# Patient Record
Sex: Female | Born: 1955 | Race: White | Hispanic: No | Marital: Married | Smoking: Former smoker
Health system: Southern US, Community
[De-identification: ages and names within clinical notes are randomized; demographics above are authoritative.]

## PROBLEM LIST (undated history)

## (undated) DIAGNOSIS — J45909 Unspecified asthma, uncomplicated: Secondary | ICD-10-CM

## (undated) DIAGNOSIS — I8393 Asymptomatic varicose veins of bilateral lower extremities: Secondary | ICD-10-CM

## (undated) DIAGNOSIS — O139 Gestational [pregnancy-induced] hypertension without significant proteinuria, unspecified trimester: Secondary | ICD-10-CM

## (undated) HISTORY — PX: BREAST REDUCTION SURGERY: SHX8

## (undated) HISTORY — DX: Unspecified asthma, uncomplicated: J45.909

## (undated) HISTORY — DX: Gestational (pregnancy-induced) hypertension without significant proteinuria, unspecified trimester: O13.9

## (undated) HISTORY — PX: ABDOMINAL HYSTERECTOMY: SHX81

## (undated) HISTORY — DX: Asymptomatic varicose veins of bilateral lower extremities: I83.93

---

## 2002-08-22 ENCOUNTER — Other Ambulatory Visit: Admission: RE | Admit: 2002-08-22 | Discharge: 2002-08-22 | Payer: Self-pay | Admitting: Family Medicine

## 2016-12-12 ENCOUNTER — Encounter: Payer: Self-pay | Admitting: Vascular Surgery

## 2016-12-13 ENCOUNTER — Ambulatory Visit (HOSPITAL_COMMUNITY)
Admission: RE | Admit: 2016-12-13 | Discharge: 2016-12-13 | Disposition: A | Discharge status: Home / Self Care | Payer: BLUE CROSS/BLUE SHIELD | Source: Ambulatory Visit | Attending: Vascular Surgery | Admitting: Vascular Surgery

## 2016-12-13 ENCOUNTER — Other Ambulatory Visit: Payer: Self-pay | Admitting: *Deleted

## 2016-12-13 ENCOUNTER — Ambulatory Visit (INDEPENDENT_AMBULATORY_CARE_PROVIDER_SITE_OTHER): Payer: BLUE CROSS/BLUE SHIELD | Admitting: Vascular Surgery

## 2016-12-13 ENCOUNTER — Encounter: Payer: Self-pay | Admitting: Vascular Surgery

## 2016-12-13 VITALS — BP 134/78 | HR 75 | Temp 97.9°F | Resp 16 | Ht 63.0 in | Wt 189.0 lb

## 2016-12-13 DIAGNOSIS — I83892 Varicose veins of left lower extremities with other complications: Secondary | ICD-10-CM | POA: Diagnosis not present

## 2016-12-13 DIAGNOSIS — I868 Varicose veins of other specified sites: Secondary | ICD-10-CM

## 2016-12-13 DIAGNOSIS — I83891 Varicose veins of right lower extremities with other complications: Secondary | ICD-10-CM

## 2016-12-13 NOTE — Progress Notes (Signed)
Subjective:     Patient ID: Ann Hatfield, female   DOB: 11/24/1955, 61 y.o.   MRN: [id number]  HPI This 61 year old female was referred by MelindaWinnikur,np for evaluation of varicose veins primarily on the left. Patient complains of swelling and aching discomfort in the left leg primarily in the thigh and calf area which worsens as the day progresses. She has no history of DVT thrombophlebitis stasis ulcers or bleeding. She will occasionally where short leg elastic compression stockings which controls the swelling somewhat. Her symptoms are worsening and she continues to get more prominent bulges in the left calf area as time goes on.  Past Medical History:  Diagnosis Date  . Varicose veins of both lower extremities     Social History  Substance Use Topics  . Smoking status: Former Smoker    Quit date: 12/13/2016  . Smokeless tobacco: Never Used  . Alcohol use Yes     Comment: rarely    No family history on file.  Allergies  Allergen Reactions  . Soy Allergy Other (See Comments)    erythema  . Penicillins Rash    Syncope,   Was given when was a child, none since  . Clindamycin      Current Outpatient Prescriptions:  .  valACYclovir (VALTREX) 1000 MG tablet, valacyclovir 1 gram tablet  TK 2 TS PO BID FOR 5 DAYS, Disp: , Rfl:  .  albuterol (PROAIR HFA) 108 (90 Base) MCG/ACT inhaler, Inhale into the lungs., Disp: , Rfl:  .  famotidine-calcium carbonate-magnesium hydroxide (PEPCID COMPLETE) 10-800-165 MG chewable tablet, Chew by mouth., Disp: , Rfl:  .  methylPREDNISolone acetate (DEPO-MEDROL) 40 MG/ML injection, Depo-Medrol 40 mg/mL suspension for injection  Take 1 mL by injection route., Disp: , Rfl:  .  polyethylene glycol powder (GLYCOLAX/MIRALAX) powder, Take by mouth., Disp: , Rfl:   Vitals:   12/13/16 1009  BP: 134/78  Pulse: 75  Resp: 16  Temp: 97.9 F (36.6 C)  SpO2: 98%  Weight: 189 lb (85.7 kg)  Height: 5\' 3"  (1.6 m)    Body mass index is 33.48  kg/m.         Review of Systems Denies chest pain, dyspnea on exertion, PND, orthopnea, hemoptysis, claudication. Occasionally develops numbness in her feet and hands very transiently area has no history of cervical or lumbar spine problems.      Objective:   Physical Exam BP 134/78 (BP Location: Left Arm, Patient Position: Sitting, Cuff Size: Normal)   Pulse 75   Temp 97.9 F (36.6 C)   Resp 16   Ht 5\' 3"  (1.6 m)   Wt 189 lb (85.7 kg)   SpO2 98%   BMI 33.48 kg/m     Gen.-alert and oriented x3 in no apparent distress HEENT normal for age Lungs no rhonchi or wheezing Cardiovascular regular rhythm no murmurs carotid pulses 3+ palpable no bruits audible Abdomen soft nontender no palpable masses Musculoskeletal free of  major deformities Skin clear -no rashes Neurologic normal Lower extremities 3+ femoral and dorsalis pedis pulses palpable bilaterally with no edema on the right mild edema on the left Bulging varicosities left medial calf with no hyperpigmentation or ulceration. Scattered spider and reticular veins bilaterally with prominent pattern on right lateral thigh, medial thigh, and lateral calf. No large bulging varicosities on the right.  Today I performed a bedside SonoSite ultrasound exam which reveals an enlarged left great saphenous vein with gross reflux supplying these painful varicosities  I ordered a formal venous  reflux exam the left leg today which I reviewed and interpreted. There is gross reflux and an enlarged left great saphenous vein supplying these painful varicosities with no DVT      Assessment:     Painful varicosities left leg due to gross reflux and large left great saphenous vein causing symptoms which are affecting patient's daily living    Plan:         #1 long leg elastic compression stockings 20-30 mm gradient #2 elevate legs as much as possible #3 ibuprofen daily on a regular basis for pain #4 return in 3 months-if no  significant improvement then patient will likely need laser ablation left great saphenous vein with multiple stab phlebectomy Return in 3 months

## 2016-12-15 ENCOUNTER — Encounter: Payer: Self-pay | Admitting: Vascular Surgery

## 2017-03-21 ENCOUNTER — Other Ambulatory Visit: Payer: Self-pay

## 2017-03-21 ENCOUNTER — Encounter: Payer: Self-pay | Admitting: Vascular Surgery

## 2017-03-21 ENCOUNTER — Ambulatory Visit: Payer: BLUE CROSS/BLUE SHIELD | Admitting: Vascular Surgery

## 2017-03-21 VITALS — BP 120/75 | HR 69 | Temp 97.5°F | Resp 16 | Ht 62.0 in | Wt 187.0 lb

## 2017-03-21 DIAGNOSIS — I83892 Varicose veins of left lower extremities with other complications: Secondary | ICD-10-CM | POA: Diagnosis not present

## 2017-03-21 NOTE — Progress Notes (Signed)
Subjective:     Patient ID: Ann Hatfield, female   DOB: 09/17/1955, 61 y.o.   MRN: [id number]  HPI This 61 year old female returns for 3 month follow-up regarding her painful varicose veins in left leg. She continues to have aching throbbing burning discomfort quickly and the calf and ankle areas the day progresses. She would like treatment because it is affecting her daily living.  Past Medical History:  Diagnosis Date  . Varicose veins of both lower extremities     Social History   Tobacco Use  . Smoking status: Former Smoker    Last attempt to quit: 12/13/2016    Years since quitting: 0.2  . Smokeless tobacco: Never Used  Substance Use Topics  . Alcohol use: Yes    Comment: rarely    Family History  Problem Relation Age of Onset  . Diabetes Mother   . Hypertension Mother   . Hyperlipidemia Mother   . Heart disease Mother     Allergies  Allergen Reactions  . Soy Allergy Other (See Comments)    erythema  . Penicillins Rash    Syncope,   Was given when was a child, none since  . Clindamycin      Current Outpatient Medications:  .  albuterol (PROAIR HFA) 108 (90 Base) MCG/ACT inhaler, Inhale into the lungs., Disp: , Rfl:  .  famotidine-calcium carbonate-magnesium hydroxide (PEPCID COMPLETE) 10-800-165 MG chewable tablet, Chew by mouth., Disp: , Rfl:  .  polyethylene glycol powder (GLYCOLAX/MIRALAX) powder, Take by mouth., Disp: , Rfl:  .  valACYclovir (VALTREX) 1000 MG tablet, valacyclovir 1 gram tablet  TK 2 TS PO BID FOR 5 DAYS, Disp: , Rfl:  .  methylPREDNISolone acetate (DEPO-MEDROL) 40 MG/ML injection, Depo-Medrol 40 mg/mL suspension for injection  Take 1 mL by injection route., Disp: , Rfl:   Vitals:   03/21/17 1028  BP: 120/75  Pulse: 69  Resp: 16  Temp: (!) 97.5 F (36.4 C)  TempSrc: Oral  SpO2: 100%  Weight: 187 lb (84.8 kg)  Height: 5\' 2"  (1.575 m)    Body mass index is 34.2 kg/m.         Review of Systems Denies chest pain, dyspnea on  exertion, PND, orthopnea, hemoptysis, claudication    Objective:   Physical Exam BP 120/75 (BP Location: Left Arm, Patient Position: Sitting, Cuff Size: Normal)   Pulse 69   Temp (!) 97.5 F (36.4 C) (Oral)   Resp 16   Ht 5\' 2"  (1.575 m)   Wt 187 lb (84.8 kg)   SpO2 100%   BMI 34.20 kg/m   Gen. well-developed female no apparent distress alert and oriented 3 Lungs no rhonchi or wheezing Left leg with bulging varicosities in the distal medial thigh and medial calf with 1+ distal edema. Large pattern of spider telangiectasia on the lateral thigh and some on the medial and lateral ankle area. No hyperpigmentation or ulceration noted. 3+ dorsalis pedis pulse palpable.  Today I reviewed the ultrasound performed last visit which revealed a large caliber left great saphenous vein with gross reflux supplying these painful varicosities     Assessment:     Painful varicosities left leg due to gross reflux left great saphenous vein causing symptoms which are affecting patient's daily living and resistant to conservative measures including long-leg elastic compression stockings 20-30 millimeter gradient, elevation, and ibuprofen-CEAP3    Plan:     Patient needs laser ablation left great saphenous vein +10-20 stab phlebectomy of painful varicosities and 2  courses of foam sclerotherapy for her treatment We'll proceed with precertification to perform this in the near future and early for symptoms

## 2017-03-23 ENCOUNTER — Other Ambulatory Visit: Payer: Self-pay | Admitting: *Deleted

## 2017-03-23 DIAGNOSIS — I83892 Varicose veins of left lower extremities with other complications: Secondary | ICD-10-CM

## 2017-04-03 ENCOUNTER — Ambulatory Visit (INDEPENDENT_AMBULATORY_CARE_PROVIDER_SITE_OTHER): Payer: BLUE CROSS/BLUE SHIELD | Admitting: Vascular Surgery

## 2017-04-03 ENCOUNTER — Encounter: Payer: Self-pay | Admitting: Vascular Surgery

## 2017-04-03 VITALS — BP 128/78 | HR 70 | Temp 98.0°F | Resp 18 | Ht 62.0 in | Wt 187.0 lb

## 2017-04-03 DIAGNOSIS — I83892 Varicose veins of left lower extremities with other complications: Secondary | ICD-10-CM

## 2017-04-03 HISTORY — PX: ENDOVENOUS ABLATION SAPHENOUS VEIN W/ LASER: SUR449

## 2017-04-03 NOTE — Progress Notes (Signed)
Subjective:     Patient ID: Ernestina ColumbiaRita B Hufstetler, female   DOB: 11/01/1955, 61 y.o.   MRN: [id number]  HPI This 61 year old female laser ablation left great saphenous vein from the proximal calf to near the saphenofemoral junction +10-20 stab phlebectomy of painful varicosities performed under local tumescent anesthesia. A total of 2245 J of energy was utilized. She tolerated the procedures well.  Review of Systems     Objective:   Physical Exam BP 128/78 (BP Location: Left Arm, Patient Position: Sitting, Cuff Size: Normal)   Pulse 70   Temp 98 F (36.7 C) (Oral)   Resp 18   Ht 5\' 2"  (1.575 m)   Wt 187 lb (84.8 kg)   SpO2 99%   BMI 34.20 kg/m        Assessment:     Well-tolerated laser ablation left great saphenous vein plus multiple stab phlebectomy of painful varicosities performed under local tumescent anesthesia    Plan:     Return in 1 week for venous duplex exam to confirm closure left great saphenous vein

## 2017-04-03 NOTE — Progress Notes (Signed)
Laser Ablation Procedure    Date: 04/03/2017   Ernestina ColumbiaRita B Siefker DOB:10/10/1955  Consent signed: Yes    Surgeon:  Dr. Quita SkyeJames D. Hart RochesterLawson  Procedure: Laser Ablation: left Greater Saphenous Vein  BP 128/78 (BP Location: Left Arm, Patient Position: Sitting, Cuff Size: Normal)   Pulse 70   Temp 98 F (36.7 C) (Oral)   Resp 18   Ht 5\' 2"  (1.575 m)   Wt 187 lb (84.8 kg)   SpO2 99%   BMI 34.20 kg/m   Tumescent Anesthesia: 475 cc 0.9% NaCl with 50 cc Lidocaine HCL 1% and 15 cc 8.4% NaHCO3  Local Anesthesia: 6 cc Lidocaine HCL and NaHCO3 (ratio 2:1)  Pulsed Mode: 15 watts, 500ms delay, 1.0 duration  Total Energy: 2245 Joules             Total Pulses: 150               Total Time:  2:30    Stab Phlebectomy: 10-20 Sites: Calf  Patient tolerated procedure well    Description of Procedure:  After marking the course of the secondary varicosities, the patient was placed on the operating table in the supine position, and the left leg was prepped and draped in sterile fashion.   Local anesthetic was administered and under ultrasound guidance the saphenous vein was accessed with a micro needle and guide wire; then the mirco puncture sheath was placed.  A guide wire was inserted saphenofemoral junction , followed by a 5 french sheath.  The position of the sheath and then the laser fiber below the junction was confirmed using the ultrasound.  Tumescent anesthesia was administered along the course of the saphenous vein using ultrasound guidance. The patient was placed in Trendelenburg position and protective laser glasses were placed on patient and staff, and the laser was fired at 15 watts continuous mode advancing 1-582mm/second for a total of 2245 joules.   For stab phlebectomies, local anesthetic was administered at the previously marked varicosities, and tumescent anesthesia was administered around the vessels.  Ten to 20 stab wounds were made using the tip of an 11 blade. And using the vein hook, the  phlebectomies were performed using a hemostat to avulse the varicosities.  Adequate hemostasis was achieved.     Steri strips were applied to the stab wounds and ABD pads and thigh high compression stockings were applied.  Ace wrap bandages were applied over the phlebectomy sites and at the top of the saphenofemoral junction. Blood loss was less than 15 cc.  The patient ambulated out of the operating room having tolerated the procedure well.

## 2017-04-05 ENCOUNTER — Encounter: Payer: Self-pay | Admitting: Vascular Surgery

## 2017-04-11 ENCOUNTER — Ambulatory Visit (INDEPENDENT_AMBULATORY_CARE_PROVIDER_SITE_OTHER): Payer: BLUE CROSS/BLUE SHIELD | Admitting: Vascular Surgery

## 2017-04-11 ENCOUNTER — Encounter: Payer: Self-pay | Admitting: Vascular Surgery

## 2017-04-11 ENCOUNTER — Ambulatory Visit (HOSPITAL_COMMUNITY)
Admission: RE | Admit: 2017-04-11 | Discharge: 2017-04-11 | Disposition: A | Discharge status: Home / Self Care | Payer: BLUE CROSS/BLUE SHIELD | Source: Ambulatory Visit | Attending: Vascular Surgery | Admitting: Vascular Surgery

## 2017-04-11 ENCOUNTER — Other Ambulatory Visit: Payer: Self-pay

## 2017-04-11 VITALS — BP 115/70 | HR 63 | Temp 97.3°F | Resp 16

## 2017-04-11 DIAGNOSIS — I83892 Varicose veins of left lower extremities with other complications: Secondary | ICD-10-CM | POA: Insufficient documentation

## 2017-04-11 DIAGNOSIS — Z9889 Other specified postprocedural states: Secondary | ICD-10-CM | POA: Diagnosis not present

## 2017-04-11 NOTE — Progress Notes (Signed)
Subjective:     Patient ID: Ann Hatfield, female   DOB: 07/29/1955, 62 y.o.   MRN: [id number]  HPI This 62 year old female returns 1 week post-laser ablation left great saphenous vein plus multiple stab phlebectomy of painful varicosities. Her discomfort is resolving. She has worn her elastic compression stocking and taken ibuprofen as instructed. She has not had any distal edema. She denies any chest pain dyspnea on exertion PND orthopnea or hemoptysis.  Review of Systems     Objective:   Physical Exam BP 115/70 (BP Location: Left Arm, Patient Position: Sitting, Cuff Size: Normal)   Pulse 63   Temp (!) 97.3 F (36.3 C) (Oral)   Resp 16   Gen. well-developed female no apparent distress alert and oriented 3 Lungs no rhonchi or wheezing Left leg with some mild discomfort to deep palpation of great saphenous vein from the proximal calf to the inguinal crease. Stab phlebectomy sites healing nicely with Steri-Strips in place. Minimal distal edema noted.  Today I ordered a venous duplex exam the left leg which I reviewed and interpreted. There is no DVT. There is total closure of the left great saphenous vein up to near the saphenofemoral junction     Assessment:     Successful laser ablation left great saphenous vein with multiple stab phlebectomy of painful varicosities with excellent early results    Plan:     Patient to return for foam sclerotherapy per Clementeen HoofLiz Wert and this will complete patient's treatment regimen

## 2017-05-10 ENCOUNTER — Ambulatory Visit (INDEPENDENT_AMBULATORY_CARE_PROVIDER_SITE_OTHER): Payer: BLUE CROSS/BLUE SHIELD | Admitting: *Deleted

## 2017-05-10 DIAGNOSIS — I868 Varicose veins of other specified sites: Secondary | ICD-10-CM

## 2017-05-10 DIAGNOSIS — I83892 Varicose veins of left lower extremities with other complications: Secondary | ICD-10-CM

## 2017-05-10 NOTE — Progress Notes (Signed)
X=.3% Sotradecol administered with a 27g butterfly.  Patient received a total of 24cc.  Treated the maj of her vessels. Combo of retic and spiders. She cried out with every stick. Easy access otherwise. Will see her for #2 3/20. Follow prn.  Compression stockings applied: Yes.

## 2017-06-21 ENCOUNTER — Ambulatory Visit (INDEPENDENT_AMBULATORY_CARE_PROVIDER_SITE_OTHER): Payer: BLUE CROSS/BLUE SHIELD | Admitting: *Deleted

## 2017-06-21 DIAGNOSIS — I83892 Varicose veins of left lower extremities with other complications: Secondary | ICD-10-CM | POA: Diagnosis not present

## 2017-06-21 NOTE — Progress Notes (Signed)
X=.3% Sotradecol administered with a 27g butterfly.  Patient received a total of 24cc.  Cutaneous Laser:pulsed mode  810j/cm2 400 ms delay  13 ms Duration 0.5 spot  Total pulses: 246 Total energy 285  Total time::02-face  Photos: Yes.    Compression stockings applied: Yes.     Treated any remaining open vessels. Tol much better this time. Easy access. Good rxn from first tx. Anticipate good results. May need more tx in the future. Follow prn.

## 2018-09-26 ENCOUNTER — Telehealth: Payer: Self-pay | Admitting: Cardiology

## 2018-09-26 NOTE — Telephone Encounter (Signed)
LVM for patient to call and schedule appointment with Dr. Stanford Breed.

## 2018-10-17 NOTE — Progress Notes (Signed)
Virtual Visit via Video Note   This visit type was conducted due to national recommendations for restrictions regarding the COVID-19 Pandemic (e.g. social distancing) in an effort to limit this patient's exposure and mitigate transmission in our community.  Due to her co-morbid illnesses, this patient is at least at moderate risk for complications without adequate follow up.  This format is felt to be most appropriate for this patient at this time.  All issues noted in this document were discussed and addressed.  A limited physical exam was performed with this format.  Please refer to the patient's chart for her consent to telehealth for [facility].   Date:  10/30/2018   ID:  Ann Hatfield, DOB June 29, 1955, MRN [id number]  Patient Location:Home Provider Location: Home  PCP:  Emelda Fear, DO  Cardiologist:  Dr Stanford Breed  Evaluation Performed:  New Patient Evaluation  Chief Complaint:  Abnormal ECG, chest tightness and palpitations  History of Present Illness:    63 year old female for evaluation of abnormal electrocardiogram, chest tightness and palpitations at request of Emelda Fear, DO.  Patient has no prior cardiac history.  Approximately 1 to 2 months ago patient developed palpitations described as heart racing.  She checked her Fitbit and her heart rate was approximately 140.  It persisted through the night and the entire next day and then resolve spontaneously.  She has had no recurrence since then.  She did have associated chest tightness but no syncope.  She now states that she occasionally has chest tightness in the early morning hours after awakening but does not have exertional chest pain.  She has dyspnea with more vigorous activities but not routine activities.  No orthopnea, PND or pedal edema.  Cardiology now asked to evaluate.  The patient does not have symptoms concerning for COVID-19 infection (fever, chills, cough, or new shortness of breath).    Past Medical  History:  Diagnosis Date  . Asthma   . Pregnancy induced hypertension   . Varicose veins of both lower extremities    Past Surgical History:  Procedure Laterality Date  . ABDOMINAL HYSTERECTOMY    . BREAST REDUCTION SURGERY    . CESAREAN SECTION    . ENDOVENOUS ABLATION SAPHENOUS VEIN W/ LASER Left 04/03/2017   endovenous laser ablation left greater saphenous vein and stab phlebectomy 10-20 incisions left leg by Tinnie Gens MD      Current Meds  Medication Sig  . albuterol (PROAIR HFA) 108 (90 Base) MCG/ACT inhaler Inhale into the lungs.  Marland Kitchen azithromycin (ZITHROMAX) 250 MG tablet Zithromax Z-Pak 250 mg tablet  TAKE 2 TABLETS (500 MG) BY ORAL ROUTE ONCE DAILY FOR 1 DAY THEN 1 TABLET (250 MG) BY ORAL ROUTE ONCE DAILY FOR 4 DAYS  . benzonatate (TESSALON PERLES) 100 MG capsule Tessalon Perles 100 mg capsule  Take 1 capsule 3 times a day by oral route as needed.  . famotidine-calcium carbonate-magnesium hydroxide (PEPCID COMPLETE) 10-800-165 MG chewable tablet Chew by mouth.  . methylPREDNISolone acetate (DEPO-MEDROL) 40 MG/ML injection Depo-Medrol 40 mg/mL suspension for injection  Take 1 mL by injection route.  . polyethylene glycol powder (GLYCOLAX/MIRALAX) powder Take by mouth.  . valACYclovir (VALTREX) 1000 MG tablet valacyclovir 1 gram tablet  TK 2 TS PO BID FOR 5 DAYS     Allergies:   Soy allergy, Penicillins, and Clindamycin   Social History   Tobacco Use  . Smoking status: Former Research scientist (life sciences)  . Smokeless tobacco: Never Used  Substance Use Topics  . Alcohol  use: Yes    Comment: rarely  . Drug use: No     Family Hx: The patient's family history includes CAD in her father; CVA in her father; Diabetes in her mother; Heart disease in her mother; Hyperlipidemia in her mother; Hypertension in her mother.  ROS:   Please see the history of present illness.    No Fever, chills  or productive cough All other systems reviewed and are negative.  Labs/Other Tests and Data  Reviewed:    EKG:  An ECG dated 09/10/18 was personally reviewed today and demonstrated:  Sinus rhythm, left axis deviation, left ventricular hypertrophy, cannot rule out prior anterior infarct.  Wt Readings from Last 3 Encounters:  10/30/18 199 lb (90.3 kg)  04/03/17 187 lb (84.8 kg)  03/21/17 187 lb (84.8 kg)     Objective:    Vital Signs:  Pulse 86   Ht 5' 3.25" (1.607 m)   Wt 199 lb (90.3 kg)   BMI 34.97 kg/m    VITAL SIGNS:  reviewed NAD Answers questions appropriately Normal affect Remainder of physical examination not performed (telehealth visit; coronavirus pandemic)  ASSESSMENT & PLAN:    1. Palpitations-etiology unclear.  She has had no recurrences since her episode 1 to 2 months ago.  I explained that in order to treat any rhythm disturbance we would need to identify underlying rhythm.  We discussed alivecor.  I also explained that if she has recurrences she could be seen at urgent care and obtain an electrocardiogram.  We will arrange an echocardiogram to assess LV function. 2. Chest tightness-strong family history of coronary disease.  Sister died suddenly in her 2340s of unknown cause.  I will arrange a cardiac CTA to exclude coronary disease. 3. Abnormal electrocardiogram-possible septal infarct on ECG.  Echocardiogram and cardiac CTA as outlined above.  COVID-19 Education: The importance of social distancing was discussed today.  Time:   Today, I have spent 26 minutes with the patient with telehealth technology discussing the above problems.     Medication Adjustments/Labs and Tests Ordered: Current medicines are reviewed at length with the patient today.  Concerns regarding medicines are outlined above.   Tests Ordered: No orders of the defined types were placed in this encounter.   Medication Changes: No orders of the defined types were placed in this encounter.   Follow Up:  Virtual Visit or In Person in 3 month(s)  Signed, Olga MillersBrian Shaquna Geigle, MD   10/30/2018 8:35 AM    [facility]

## 2018-10-29 ENCOUNTER — Telehealth: Payer: Self-pay | Admitting: Cardiology

## 2018-10-29 NOTE — Telephone Encounter (Signed)
LVM, aaking pt o call back to be pre-screened for COVID-19

## 2018-10-29 NOTE — Telephone Encounter (Signed)
° ° °Virtual Visit Pre-Appointment Phone Call ° °"(Name), I am calling you today to discuss your upcoming appointment. We are currently trying to limit exposure to the virus that causes COVID-19 by seeing patients at home rather than in the office." ° ° °1. Confirm consent - "In the setting of the current Covid19 crisis, you are scheduled for a (phone or video) visit with your provider on (date) at (time).  Just as we do with many in-office visits, in order for you to participate in this visit, we must obtain consent.  If you'd like, I can send this to your mychart (if signed up) or email for you to review.  Otherwise, I can obtain your verbal consent now.  All virtual visits are billed to your insurance company just like a normal visit would be.  By agreeing to a virtual visit, we'd like you to understand that the technology does not allow for your provider to perform an examination, and thus may limit your provider's ability to fully assess your condition. If your provider identifies any concerns that need to be evaluated in person, we will make arrangements to do so.  Finally, though the technology is pretty good, we cannot assure that it will always work on either your or our end, and in the setting of a video visit, we may have to convert it to a phone-only visit.  In either situation, we cannot ensure that we have a secure connection.  Are you willing to proceed?" STAFF: Did the patient verbally acknowledge consent to telehealth visit? Document YES/NO here Yes  °2.  °3.  FULL LENGTH CONSENT FOR TELE-HEALTH VISIT  ° °I hereby voluntarily request, consent and authorize [facility] and its employed or contracted physicians, physician assistants, nurse practitioners or other licensed health care professionals (the Practitioner), to provide me with telemedicine health care services (the “Services") as deemed necessary by the treating Practitioner. I acknowledge and consent to receive the Services by the  Practitioner via telemedicine. I understand that the telemedicine visit will involve communicating with the Practitioner through live audiovisual communication technology and the disclosure of certain medical information by electronic transmission. I acknowledge that I have been given the opportunity to request an in-person assessment or other available alternative prior to the telemedicine visit and am voluntarily participating in the telemedicine visit. ° °I understand that I have the right to withhold or withdraw my consent to the use of telemedicine in the course of my care at any time, without affecting my right to future care or treatment, and that the Practitioner or I may terminate the telemedicine visit at any time. I understand that I have the right to inspect all information obtained and/or recorded in the course of the telemedicine visit and may receive copies of available information for a reasonable fee.  I understand that some of the potential risks of receiving the Services via telemedicine include:  °• Delay or interruption in medical evaluation due to technological equipment failure or disruption; °• Information transmitted may not be sufficient (e.g. poor resolution of images) to allow for appropriate medical decision making by the Practitioner; and/or  °• In rare instances, security protocols could fail, causing a breach of personal health information. ° °Furthermore, I acknowledge that it is my responsibility to provide information about my medical history, conditions and care that is complete and accurate to the best of my ability. I acknowledge that Practitioner's advice, recommendations, and/or decision may be based on factors not within their control, such   as incomplete or inaccurate data provided by me or distortions of diagnostic images or specimens that may result from electronic transmissions. I understand that the practice of medicine is not an exact science and that Practitioner makes  no warranties or guarantees regarding treatment outcomes. I acknowledge that I will receive a copy of this consent concurrently upon execution via email to the email address I last provided but may also request a printed copy by calling the office of [facility].   ° °I understand that my insurance will be billed for this visit.  ° °I have read or had this consent read to me. °• I understand the contents of this consent, which adequately explains the benefits and risks of the Services being provided via telemedicine.  °• I have been provided ample opportunity to ask questions regarding this consent and the Services and have had my questions answered to my satisfaction. °• I give my informed consent for the services to be provided through the use of telemedicine in my medical care ° °By participating in this telemedicine visit I agree to the above. ° °

## 2018-10-30 ENCOUNTER — Encounter: Payer: Self-pay | Admitting: Cardiology

## 2018-10-30 ENCOUNTER — Other Ambulatory Visit: Payer: Self-pay

## 2018-10-30 ENCOUNTER — Telehealth (INDEPENDENT_AMBULATORY_CARE_PROVIDER_SITE_OTHER): Payer: BC Managed Care – PPO | Admitting: Cardiology

## 2018-10-30 VITALS — HR 86 | Ht 63.25 in | Wt 199.0 lb

## 2018-10-30 DIAGNOSIS — R072 Precordial pain: Secondary | ICD-10-CM | POA: Diagnosis not present

## 2018-10-30 DIAGNOSIS — R9431 Abnormal electrocardiogram [ECG] [EKG]: Secondary | ICD-10-CM | POA: Diagnosis not present

## 2018-10-30 DIAGNOSIS — R002 Palpitations: Secondary | ICD-10-CM | POA: Diagnosis not present

## 2018-10-30 MED ORDER — METOPROLOL TARTRATE 100 MG PO TABS: ORAL_TABLET | ORAL | 0 refills | Status: DC

## 2018-10-30 NOTE — Patient Instructions (Addendum)
Medication Instructions:  NO CHANGE If you need a refill on your cardiac medications before your next appointment, please call your pharmacy.   Lab work: If you have labs (blood work) drawn today and your tests are completely normal, you will receive your results only by: Marland Kitchen MyChart Message (if you have MyChart) OR . A paper copy in the mail If you have any lab test that is abnormal or we need to change your treatment, we will call you to review the results.  Testing/Procedures: Your physician has requested that you have an echocardiogram. Echocardiography is a painless test that uses sound waves to create images of your heart. It provides your doctor with information about the size and shape of your heart and how well your heart's chambers and valves are working. This procedure takes approximately one hour. There are no restrictions for this procedure.[place] SCAN= Your cardiac CT will be scheduled at   [facility] [place], [place] [address] [phone]   Please arrive at the [facility] main entrance of [facility] 30-45 minutes prior to test start time. Proceed to the [facility] Radiology Department (first floor) to check-in and test prep.  Please follow these instructions carefully (unless otherwise directed):  Hold all erectile dysfunction medications at least 48 hours prior to test.  On the Night Before the Test: . Be sure to Drink plenty of water. . Do not consume any caffeinated/decaffeinated beverages or chocolate 12 hours prior to your test. . Do not take any antihistamines 12 hours prior to your test.  On the Day of the Test: . Drink plenty of water. Do not drink any water within one hour of the test. . Do not eat any food 4 hours prior to the test. . You may take your regular medications prior to the test.  . Take metoprolol (Lopressor) two hours prior to test. . TAKE METOPROLOL 100 MG 2  HOURS PRIOR TO SCAN . FEMALES- please wear underwire-free bra if available  After the Test: . Drink plenty of water. . After receiving IV contrast, you may experience a mild flushed feeling. This is normal. . On occasion, you may experience a mild rash up to 24 hours after the test. This is not dangerous. If this occurs, you can take Benadryl 25 mg and increase your fluid intake. . If you experience trouble breathing, this can be serious. If it is severe call 911 IMMEDIATELY. If it is mild, please call our office. . If you take any of these medications: Glipizide/Metformin, Avandament, Glucavance, please do not take 48 hours after completing test.    Please contact the cardiac imaging nurse navigator should you have any questions/concerns Marchia Bond, RN Navigator Cardiac Imaging [facility] [phone] Office  [phone] Cell       Follow-Up: At [facility], you and your health needs are our priority.  As part of our continuing mission to provide you with exceptional heart care, we have created designated Provider Care Teams.  These Care Teams include your primary Cardiologist (physician) and Advanced Practice Providers (APPs -  Physician Assistants and Nurse Practitioners) who all work together to provide you with the care you need, when you need it. Your physician recommends that you schedule a follow-up appointment in: [place]

## 2018-11-02 ENCOUNTER — Ambulatory Visit (HOSPITAL_COMMUNITY): Payer: BC Managed Care – PPO | Attending: Cardiology

## 2018-11-02 ENCOUNTER — Other Ambulatory Visit: Payer: Self-pay

## 2018-11-02 DIAGNOSIS — R9431 Abnormal electrocardiogram [ECG] [EKG]: Secondary | ICD-10-CM

## 2018-11-02 MED ORDER — PERFLUTREN LIPID MICROSPHERE
1.0000 mL | INTRAVENOUS | Status: AC | PRN
  Administered 2018-11-02: 2 mL via INTRAVENOUS

## 2018-11-16 ENCOUNTER — Other Ambulatory Visit: Payer: Self-pay | Admitting: *Deleted

## 2018-11-16 DIAGNOSIS — R072 Precordial pain: Secondary | ICD-10-CM

## 2018-11-22 ENCOUNTER — Telehealth (HOSPITAL_COMMUNITY): Payer: Self-pay | Admitting: Emergency Medicine

## 2018-11-22 LAB — BASIC METABOLIC PANEL
BUN/Creatinine Ratio: 20 (ref 12–28)
BUN: 15 mg/dL (ref 8–27)
CO2: 23 mmol/L (ref 20–29)
Calcium: 9.7 mg/dL (ref 8.7–10.3)
Chloride: 106 mmol/L (ref 96–106)
Creatinine, Ser: 0.74 mg/dL (ref 0.57–1.00)
GFR calc Af Amer: 100 mL/min/{1.73_m2} (ref >59)
GFR calc non Af Amer: 87 mL/min/{1.73_m2} (ref >59)
Glucose: 94 mg/dL (ref 65–99)
Potassium: 4.7 mmol/L (ref 3.5–5.2)
Sodium: 144 mmol/L (ref 134–144)

## 2018-11-22 NOTE — Telephone Encounter (Signed)
Reaching out to patient to offer assistance regarding upcoming cardiac imaging study; pt verbalizes understanding of appt date/time, parking situation and where to check in, pre-test NPO status and medications ordered, and verified current allergies; name and call back number provided for further questions should they arise Cashtyn Pouliot RN Navigator Cardiac Imaging [facility] [phone] office [phone] cell  Pt denies covid symptoms, verbalized understanding of visitor policy. 

## 2018-11-23 ENCOUNTER — Encounter (HOSPITAL_COMMUNITY): Payer: Self-pay

## 2018-11-23 ENCOUNTER — Other Ambulatory Visit: Payer: Self-pay

## 2018-11-23 ENCOUNTER — Ambulatory Visit (HOSPITAL_COMMUNITY): Payer: BC Managed Care – PPO

## 2018-11-23 ENCOUNTER — Ambulatory Visit (HOSPITAL_COMMUNITY)
Admission: RE | Admit: 2018-11-23 | Discharge: 2018-11-23 | Disposition: A | Discharge status: Home / Self Care | Payer: BC Managed Care – PPO | Source: Ambulatory Visit | Attending: Cardiology | Admitting: Cardiology

## 2018-11-23 DIAGNOSIS — R072 Precordial pain: Secondary | ICD-10-CM | POA: Insufficient documentation

## 2018-11-23 DIAGNOSIS — R079 Chest pain, unspecified: Secondary | ICD-10-CM

## 2018-11-23 MED ORDER — NITROGLYCERIN 0.4 MG SL SUBL
0.8000 mg | SUBLINGUAL_TABLET | Freq: Once | SUBLINGUAL | Status: AC
  Administered 2018-11-23: 0.8 mg via SUBLINGUAL
  Filled 2018-11-23: qty 25

## 2018-11-23 MED ORDER — IOHEXOL 350 MG/ML SOLN
80.0000 mL | Freq: Once | INTRAVENOUS | Status: AC | PRN
  Administered 2018-11-23: 16:00:00 80 mL via INTRAVENOUS

## 2018-11-23 MED ORDER — NITROGLYCERIN 0.4 MG SL SUBL
SUBLINGUAL_TABLET | SUBLINGUAL | Status: AC
  Filled 2018-11-23: qty 1

## 2018-11-23 NOTE — Progress Notes (Signed)
Ct complete. Patient denies any complaints. Offered patient snack and drink.  

## 2019-02-04 NOTE — Progress Notes (Signed)
Virtual Visit via Video Note   This visit type was conducted due to national recommendations for restrictions regarding the COVID-19 Pandemic (e.g. social distancing) in an effort to limit this patient's exposure and mitigate transmission in our community.  Due to her co-morbid illnesses, this patient is at least at moderate risk for complications without adequate follow up.  This format is felt to be most appropriate for this patient at this time.  All issues noted in this document were discussed and addressed.  A limited physical exam was performed with this format.  Please refer to the patient's chart for her consent to telehealth for [facility].   Date:  02/05/2019   ID:  Ann Hatfield, DOB 17-Feb-1956, MRN [id number]  Patient Location:Home Provider Location: Home  PCP:  Ann Pont, DO  Cardiologist:  Dr Jens Som  Evaluation Performed:  Follow-Up Visit  Chief Complaint:  FU CP and palpitations  History of Present Illness:    Follow-up chest pain, palpitations and abnormal electrocardiogram.  Patient seen July 2020 with above symptoms.  Echocardiogram July 2020 showed normal LV systolic function, grade 1 diastolic dysfunction, trace aortic insufficiency.  Cardiac CTA August 2020 showed calcium score of 0 and no coronary disease.  There was note of a 2.3 cm fat density in the left breast and correlation with mammogram recommended.  Since last seen patient denies dyspnea or recurrent chest pain.  She did have a day where her heart rate was elevated in the 120-130 range by her report.  She has not had syncope.  The patient does not have symptoms concerning for COVID-19 infection (fever, chills, cough, or new shortness of breath).    Past Medical History:  Diagnosis Date  . Asthma   . Pregnancy induced hypertension   . Varicose veins of both lower extremities    Past Surgical History:  Procedure Laterality Date  . ABDOMINAL HYSTERECTOMY    . BREAST REDUCTION SURGERY    .  CESAREAN SECTION    . ENDOVENOUS ABLATION SAPHENOUS VEIN W/ LASER Left 04/03/2017   endovenous laser ablation left greater saphenous vein and stab phlebectomy 10-20 incisions left leg by Josephina Gip MD      Current Meds  Medication Sig  . albuterol (PROAIR HFA) 108 (90 Base) MCG/ACT inhaler Inhale into the lungs.  . famotidine-calcium carbonate-magnesium hydroxide (PEPCID COMPLETE) 10-800-165 MG chewable tablet Chew by mouth.  . polyethylene glycol powder (GLYCOLAX/MIRALAX) powder Take by mouth.  . terbinafine (LAMISIL) 250 MG tablet Take 250 mg by mouth daily.  . valACYclovir (VALTREX) 1000 MG tablet valacyclovir 1 gram tablet  TK 2 TS PO BID FOR 5 DAYS     Allergies:   Soy allergy, Penicillins, and Clindamycin   Social History   Tobacco Use  . Smoking status: Former Games developer  . Smokeless tobacco: Never Used  Substance Use Topics  . Alcohol use: Yes    Comment: rarely  . Drug use: No     Family Hx: The patient's family history includes CAD in her father; CVA in her father; Diabetes in her mother; Heart disease in her mother; Hyperlipidemia in her mother; Hypertension in her mother.  ROS:   Please see the history of present illness.    No Fever, chills  or productive cough All other systems reviewed and are negative.  Recent Labs: 11/21/2018: BUN 15; Creatinine, Ser 0.74; Potassium 4.7; Sodium 144     Wt Readings from Last 3 Encounters:  02/05/19 192 lb 8 oz (87.3 kg)  10/30/18 199 lb (90.3 kg)  04/03/17 187 lb (84.8 kg)     Objective:    Vital Signs:  Ht 5\' 3"  (1.6 m)   Wt 192 lb 8 oz (87.3 kg)   BMI 34.10 kg/m    VITAL SIGNS:  reviewed NAD Answers questions appropriately Normal affect Remainder of physical examination not performed (telehealth visit; coronavirus pandemic)  ASSESSMENT & PLAN:    1. Chest pain-no recurrent symptoms.  CTA showed no coronary disease.  No plans for further evaluation. 2. Palpitations-echocardiogram showed normal LV function.   She does state she had a day where her heart rate was elevated in the 120-130 range.  We discussed the possibilities of further evaluation including alivecor, apple watch or monitor.  She will contact us if she is able to obtain a rhythm strip when she is having symptoms.  We can consider a beta-blocker in the future if needed. 3. Abnormal electrocardiogram-possible septal infarct on prior ECG.  Echocardiogram showed normal wall motion and cardiac CTA showed no coronary disease. 4. Left breast density-patient states she had a mammogram with primary care.  COVID-19 Education: The importance of social distancing was discussed today.  Time:   Today, I have spent 16 minutes with the patient with telehealth technology discussing the above problems.     Medication Adjustments/Labs and Tests Ordered: Current medicines are reviewed at length with the patient today.  Concerns regarding medicines are outlined above.   Tests Ordered: No orders of the defined types were placed in this encounter.   Medication Changes: No orders of the defined types were placed in this encounter.   Follow Up:  1 year  Signed, Kirk Ruths, MD  02/05/2019 8:06 AM    [name]

## 2019-02-05 ENCOUNTER — Encounter: Payer: Self-pay | Admitting: Cardiology

## 2019-02-05 ENCOUNTER — Telehealth (INDEPENDENT_AMBULATORY_CARE_PROVIDER_SITE_OTHER): Payer: BC Managed Care – PPO | Admitting: Cardiology

## 2019-02-05 VITALS — Ht 63.0 in | Wt 192.5 lb

## 2019-02-05 DIAGNOSIS — R002 Palpitations: Secondary | ICD-10-CM

## 2019-02-05 DIAGNOSIS — R9431 Abnormal electrocardiogram [ECG] [EKG]: Secondary | ICD-10-CM

## 2019-02-05 DIAGNOSIS — R072 Precordial pain: Secondary | ICD-10-CM

## 2019-02-05 NOTE — Patient Instructions (Signed)
Medication Instructions:  The current medical regimen is effective;  continue present plan and medications.  *If you need a refill on your cardiac medications before your next appointment, please call your pharmacy*  Follow-Up: At [facility], you and your health needs are our priority.  As part of our continuing mission to provide you with exceptional heart care, we have created designated Provider Care Teams.  These Care Teams include your primary Cardiologist (physician) and Advanced Practice Providers (APPs -  Physician Assistants and Nurse Practitioners) who all work together to provide you with the care you need, when you need it.  Your next appointment:   12 months  The format for your next appointment:   Either In Person or Virtual  Provider:   You may see Dr.Crenshaw or one of the following Advanced Practice Providers on your designated Care Team:    Luke Kilroy, PA-C  Callie Goodrich, PA-C  Jesse Cleaver, FNP  

## 2020-02-21 ENCOUNTER — Telehealth (INDEPENDENT_AMBULATORY_CARE_PROVIDER_SITE_OTHER): Payer: BC Managed Care – PPO | Admitting: Cardiology

## 2020-02-21 ENCOUNTER — Encounter: Payer: Self-pay | Admitting: Cardiology

## 2020-02-21 VITALS — Ht 63.0 in | Wt 172.0 lb

## 2020-02-21 DIAGNOSIS — R9431 Abnormal electrocardiogram [ECG] [EKG]: Secondary | ICD-10-CM

## 2020-02-21 DIAGNOSIS — R072 Precordial pain: Secondary | ICD-10-CM | POA: Diagnosis not present

## 2020-02-21 DIAGNOSIS — R002 Palpitations: Secondary | ICD-10-CM

## 2020-02-21 NOTE — Progress Notes (Signed)
Virtual Visit via Video Note changed to phone visit at patient request.   This visit type was conducted due to national recommendations for restrictions regarding the COVID-19 Pandemic (e.g. social distancing) in an effort to limit this patient's exposure and mitigate transmission in our community.  Due to her co-morbid illnesses, this patient is at least at moderate risk for complications without adequate follow up.  This format is felt to be most appropriate for this patient at this time.  All issues noted in this document were discussed and addressed.  A limited physical exam was performed with this format.  Please refer to the patient's chart for her consent to telehealth for Ann Hatfield.      Date:  02/21/2020   ID:  Ann Hatfield, DOB 10-Dec-1955, MRN [id number]  Patient Location:Home Provider Location: Home  PCP:  Lorelei Pont, DO  Cardiologist:  Dr Jens Som  Evaluation Performed:  Follow-Up Visit  Chief Complaint:  FU CP and palpitations  History of Present Illness:    Follow-up chest pain, palpitations and abnormal electrocardiogram.  Patient seen July 2020 with above symptoms.  Echocardiogram July 2020 showed normal LV systolic function, grade 1 diastolic dysfunction, trace aortic insufficiency.  Cardiac CTA August 2020 showed calcium score of 0 and no coronary disease.  There was note of a 2.3 cm fat density in the left breast and correlation with mammogram recommended.  Since last seen patient denies dyspnea, chest pain, palpitations or syncope.  The patient does not have symptoms concerning for COVID-19 infection (fever, chills, cough, or new shortness of breath).    Past Medical History:  Diagnosis Date  . Asthma   . Pregnancy induced hypertension   . Varicose veins of both lower extremities    Past Surgical History:  Procedure Laterality Date  . ABDOMINAL HYSTERECTOMY    . BREAST REDUCTION SURGERY    . CESAREAN SECTION    . ENDOVENOUS ABLATION SAPHENOUS VEIN  W/ LASER Left 04/03/2017   endovenous laser ablation left greater saphenous vein and stab phlebectomy 10-20 incisions left leg by Josephina Gip MD      Current Meds  Medication Sig  . albuterol (PROAIR HFA) 108 (90 Base) MCG/ACT inhaler Inhale into the lungs.  . famotidine-calcium carbonate-magnesium hydroxide (PEPCID COMPLETE) 10-800-165 MG chewable tablet Chew by mouth.  . polyethylene glycol powder (GLYCOLAX/MIRALAX) powder Take by mouth.  . valACYclovir (VALTREX) 1000 MG tablet valacyclovir 1 gram tablet  TK 2 TS PO BID FOR 5 DAYS     Allergies:   Soy allergy, Penicillins, and Clindamycin   Social History   Tobacco Use  . Smoking status: Former Games developer  . Smokeless tobacco: Never Used  Substance Use Topics  . Alcohol use: Yes    Comment: rarely  . Drug use: No     Family Hx: The patient's family history includes CAD in her father; CVA in her father; Diabetes in her mother; Heart disease in her mother; Hyperlipidemia in her mother; Hypertension in her mother.  ROS:   Please see the history of present illness.    No Fever, chills  or productive cough All other systems reviewed and are negative.  Wt Readings from Last 3 Encounters:  02/21/20 172 lb (78 kg)  02/05/19 192 lb 8 oz (87.3 kg)  10/30/18 199 lb (90.3 kg)     Objective:    Vital Signs:  Ht 5\' 3"  (1.6 m)   Wt 172 lb (78 kg)   BMI 30.47 kg/m    VITAL  SIGNS:  reviewed NAD Answers questions appropriately Normal affect Remainder of physical examination not performed (telehealth visit; coronavirus pandemic)  ASSESSMENT & PLAN:    1. History of chest pain-previous CTA did not reveal coronary disease.  She has had no further symptoms.  No plans for further evaluation. 2. Palpitations-previous echocardiogram demonstrated normal LV function.  No symptoms since last office visit.  We will consider a beta-blocker in the future if needed.  We can also consider an apple watch in the future to document her rhythm  disturbance if she has more symptoms. 3. History of abnormal electrocardiogram-previous ECG with possible septal infarct.  However echocardiogram showed normal wall motion and cardiac CTA showed no coronary disease.  COVID-19 Education: The importance of social distancing was discussed today.  Time:   Today, I have spent 14 minutes with the patient with telehealth technology discussing the above problems.     Medication Adjustments/Labs and Tests Ordered: Current medicines are reviewed at length with the patient today.  Concerns regarding medicines are outlined above.   Tests Ordered: No orders of the defined types were placed in this encounter.   Medication Changes: No orders of the defined types were placed in this encounter.   Follow Up:  In Person in 1 year(s)  Signed, Olga Millers, MD  02/21/2020 8:09 AM    [facility]

## 2020-02-21 NOTE — Patient Instructions (Signed)

## 2020-06-16 IMAGING — CT CT HEAR MORPH WITH CTA COR WITH SCORE WITH CA WITH CONTRAST AND
4 of 7 series · 8 of 20 positions shown, 9 images · IV contrast (APPLIED)
Comparison: None.
COMPARISON: None.

Addendum:
EXAM:
OVER-READ INTERPRETATION  CT CHEST

The following report is an over-read performed by radiologist Dr.
Yusniel Titus [REDACTED] on 11/23/2018. This over-read
does not include interpretation of cardiac or coronary anatomy or
pathology. The coronary CTA interpretation by the cardiologist is
attached.
CLINICAL DATA: 63 yo female with chest tightness and palpitations
Cardiac/Coronary  CT
TECHNIQUE: The patient was scanned on a [employer] Force scanner.

[Series 6: ts syst sharp 71 % · axial · 0.39mm/px · z∈[-42,+4]mm · 2 of 348 slices shown]
[im 116/348  lung]
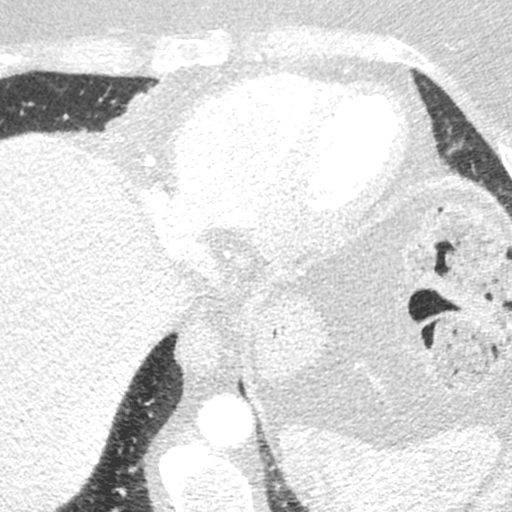
[im 232/348  lung]
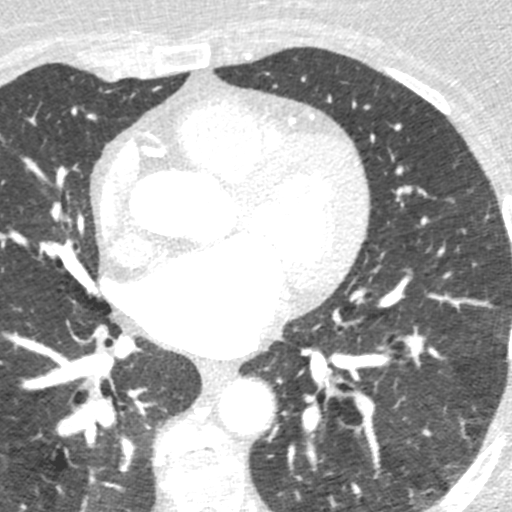

[Series 7: best diast 71 % · axial · 0.39mm/px · z∈[-42,+4]mm · 2 of 348 slices shown, 3 images]
[im 116/348  vessel]
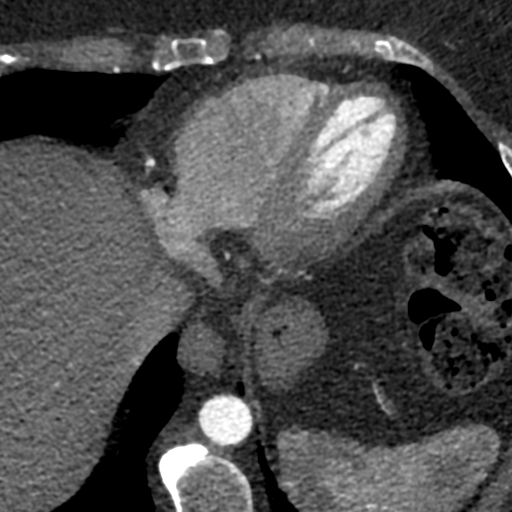
[im 116/348  lung]
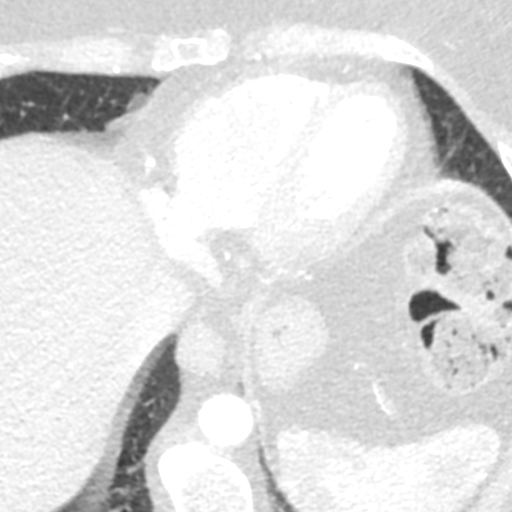
[im 232/348  vessel]
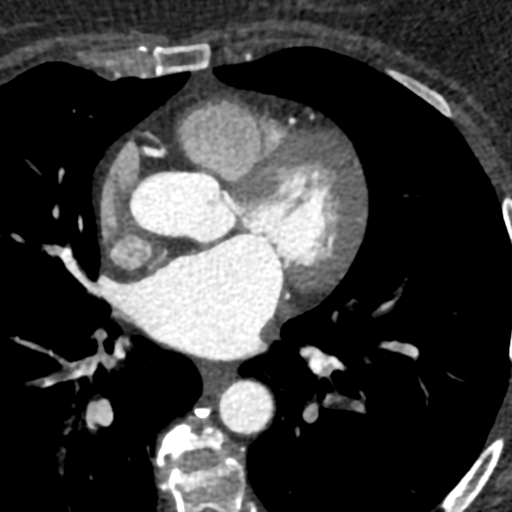

[Series 8: best syst 71 % · axial · 0.39mm/px · z∈[-42,+4]mm · 2 of 348 slices shown]
[im 116/348  vessel]
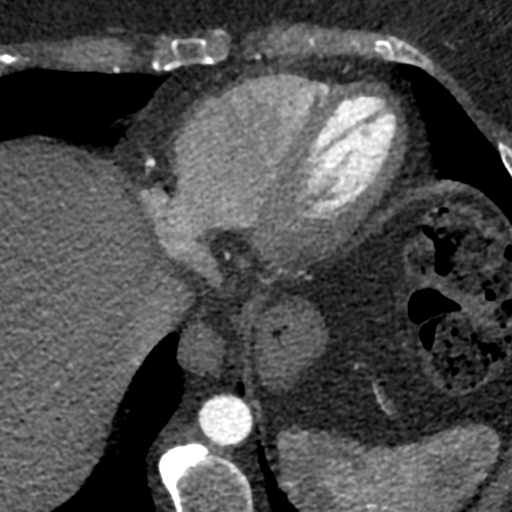
[im 232/348  vessel]
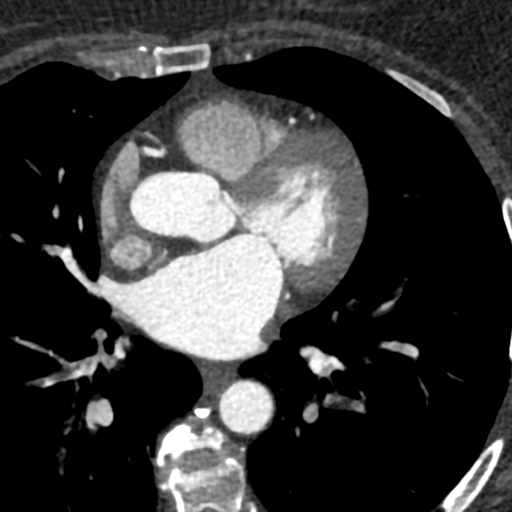

[Series 9: ts diast sharp 71 % · axial · 0.39mm/px · z∈[-42,+4]mm · 2 of 348 slices shown]
[im 116/348  lung]
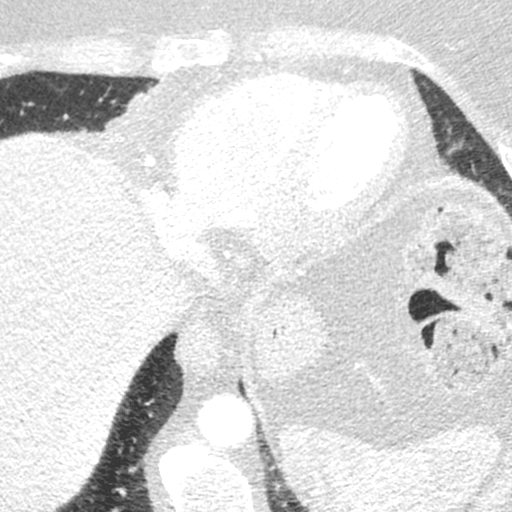
[im 232/348  lung]
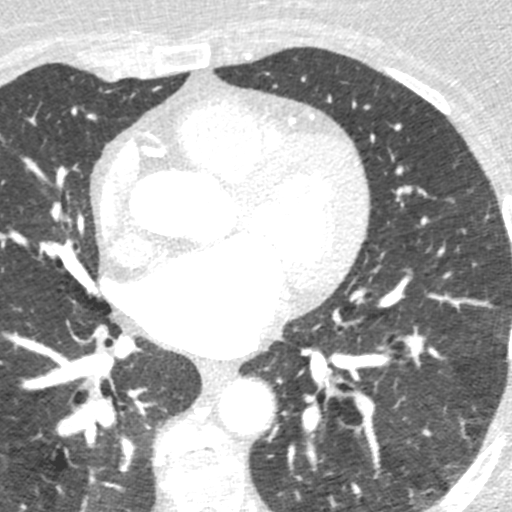

[8 of 20 positions shown; findings below may reference images not displayed]

FINDINGS: Vascular: Heart is normal size.  Visualized aorta normal caliber.

Mediastinum/Nodes: No adenopathy in the lower mediastinum or hila.
Small hiatal hernia.

Lungs/Pleura: Visualized lungs clear.  No effusions.

Upper Abdomen: Imaging into the upper abdomen shows no acute
findings.

Musculoskeletal: 2.3 cm fat density mass seen within the medial left
breast. This may reflect lipoma or area of fat necrosis. No acute
bony abnormality.
IMPRESSION: Small hiatal hernia.

2.3 cm fat density mass like structure in the medial left breast.
This may reflect a lipoma or area of fat necrosis. Recommend
correlation with mammography.
FINDINGS: A 120 kV prospective scan was triggered in the descending thoracic
aorta at 111 HU's. Axial non-contrast 3 mm slices were carried out
through the heart. The data set was analyzed on a dedicated work
station and scored using the Agatson method. Gantry rotation speed
was 250 msecs and collimation was .6 mm. Metoprolol 100 mg po and
0.8 mg of sl NTG was given. The 3D data set was reconstructed in 5%
intervals of the 67-82 % of the R-R cycle. Diastolic phases were
analyzed on a dedicated work station using MPR, MIP and VRT modes.
The patient received 80 cc of contrast.

Aorta:  Normal size (27 mm).  No calcifications.  No dissection.

Aortic Valve:  Trileaflet.  No calcifications.

Coronary Arteries:  Normal coronary origin.  Right dominance.

RCA is a dominant artery that gives rise to PDA. There is no plaque.

Left main is a large artery that gives rise to LAD and LCX arteries.

LAD is a large vessel that has no plaque.  2 small diagonals.

LCX is a small non-dominant artery that gives rise to one small OM1
branch and a small PL. There is no plaque.

Other findings:

Normal pulmonary vein drainage into the left atrium.

Normal let atrial appendage without a thrombus.

Normal size of the pulmonary artery.
IMPRESSION: 1. Coronary calcium score of 0. This was 0 percentile for age and
sex matched control.

2. Normal coronary origin with right dominance.

3. No evidence of CAD.

Khoiroh Nyimut

*** End of Addendum ***
EXAM:
OVER-READ INTERPRETATION  CT CHEST

The following report is an over-read performed by radiologist Dr.
Yusniel Titus [REDACTED] on 11/23/2018. This over-read
does not include interpretation of cardiac or coronary anatomy or
pathology. The coronary CTA interpretation by the cardiologist is
attached.
FINDINGS: Vascular: Heart is normal size.  Visualized aorta normal caliber.

Mediastinum/Nodes: No adenopathy in the lower mediastinum or hila.
Small hiatal hernia.

Lungs/Pleura: Visualized lungs clear.  No effusions.

Upper Abdomen: Imaging into the upper abdomen shows no acute
findings.

Musculoskeletal: 2.3 cm fat density mass seen within the medial left
breast. This may reflect lipoma or area of fat necrosis. No acute
bony abnormality.
IMPRESSION: Small hiatal hernia.

2.3 cm fat density mass like structure in the medial left breast.
This may reflect a lipoma or area of fat necrosis. Recommend
correlation with mammography.

## 2022-01-24 ENCOUNTER — Telehealth: Payer: Self-pay | Admitting: Cardiology

## 2022-01-24 NOTE — Telephone Encounter (Signed)
Spoke to patient she stated she felt bad all day this past Saturday,sob.Stated yesterday she noticed her heart beat fast ranging 138 to 142,She had alittle chest pressure.Stated today she feels tired.No fast heart beat.Pulse in 70's.No chest pressure.Appointment scheduled with Fabian Sharp PA 10/25 at 2:45 pm.

## 2022-01-24 NOTE — Telephone Encounter (Signed)
STAT if HR is under 50 or over 120 (normal HR is 60-100 beats per minute)  What is your heart rate?   Do you have a log of your heart rate readings (document readings)?  150 - all day yesterday   Do you have any other symptoms? Some SOB  and just didn't feel good and pt was scared. She said her hr was around 150 all day yesterday, even while she was just sitting down.

## 2022-01-25 NOTE — Progress Notes (Unsigned)
Cardiology Office Note:    Date:  01/26/2022   ID:  Ann Hatfield, DOB [birth year], MRN [id number]  PCP:  Lorelei Pont, DO   [facility] Providers Cardiologist:  Olga Millers, MD Cardiology APP:  Marcelino Duster, PA { Referring MD: Lorelei Pont, DO   Chief Complaint  Patient presents with   Follow-up    Rapid heart rate    History of Present Illness:    Ann Hatfield is a 66 y.o. female with a hx of palpitations with rapid heartbeat and abnormal ECG with possible septal infarct.  This patient has only ever been seen by cardiology via telemedicine appointments.  She was first evaluated 10/30/2018 by Dr. Jens Som for an abnormal EKG and chest tightness with palpitations.  She had no prior cardiac history.  Echocardiogram was obtained and showed LVEF 55-60%, normal RV function, and no significant valvular disease.  A coronary CTA was also obtained 11/23/2018 and revealed a calcium score of 0, no evidence of CAD.  In follow-up appointments she continued to have episodes of elevated heart rate.  Dr. Jens Som reviewed options for monitoring including a cardia device, Apple Watch or heart monitor.  We also discussed possible beta-blocker in the future.  She was last evaluated by telemedicine visit 02/21/2020.  She had had no further episodes of elevated heart rate at that time and no monitoring was planned.   She called our office reporting rapid heartbeat and feeling poorly on Saturday.  This was associated with shortness of breath.  Heart rate ranged from 138-142 with some chest pressure.  She now is feeling fatigued.  She was placed on my schedule.   She presents today with a right boot on her foot - she broke her fifth metatarsal bone while bowling. This happened 5 weeks ago. She had an episode of heart racing in the 130-140s overnight and into the next day. Interestingly she reports a lower heart rate when she was walking around, higher HR when sitting still.   She has a  family history of strokes. Her sister had sudden cardiac death at age 67. She shows me her HR from her fitbit, unfortunately I do not have rhythm strips. We discussed 14 day monitor, but I'm not sure we will catch an arrhythmia since they occur infrequently. She is in sinus rhythm today.   Past Medical History:  Diagnosis Date   Asthma    Pregnancy induced hypertension    Varicose veins of both lower extremities     Past Surgical History:  Procedure Laterality Date   ABDOMINAL HYSTERECTOMY     BREAST REDUCTION SURGERY     CESAREAN SECTION     ENDOVENOUS ABLATION SAPHENOUS VEIN W/ LASER Left 04/03/2017   endovenous laser ablation left greater saphenous vein and stab phlebectomy 10-20 incisions left leg by Josephina Gip MD     Current Medications: Current Meds  Medication Sig   aspirin EC (ASPIR-LOW) 81 MG tablet    famotidine-calcium carbonate-magnesium hydroxide (PEPCID COMPLETE) 10-800-165 MG chewable tablet Chew by mouth.   metoprolol tartrate (LOPRESSOR) 25 MG tablet Take 1 tablet (25 mg total) by mouth as needed. For palpitations more than 1 hour.   polyethylene glycol powder (GLYCOLAX/MIRALAX) powder Take by mouth.   valACYclovir (VALTREX) 1000 MG tablet valacyclovir 1 gram tablet  TK 2 TS PO BID FOR 5 DAYS     Allergies:   Soy allergy, Penicillins, and Clindamycin   Social History   Socioeconomic History   Marital status: Married  Spouse name: Not on file   Number of children: 3   Years of education: Not on file   Highest education level: Not on file  Occupational History   Not on file  Tobacco Use   Smoking status: Former   Smokeless tobacco: Never  Substance and Sexual Activity   Alcohol use: Yes    Comment: rarely   Drug use: No   Sexual activity: Not on file  Other Topics Concern   Not on file  Social History Narrative   Not on file   Social Determinants of Health   Financial Resource Strain: Not on file  Food Insecurity: Not on file   Transportation Needs: Not on file  Physical Activity: Not on file  Stress: Not on file  Social Connections: Not on file     Family History: The patient's family history includes CAD in her father; CVA in her father; Diabetes in her mother; Heart disease in her mother; Hyperlipidemia in her mother; Hypertension in her mother.  ROS:   Please see the history of present illness.     All other systems reviewed and are negative.  EKGs/Labs/Other Studies Reviewed:    The following studies were reviewed today:  Coronary CTA 11/23/2018: IMPRESSION: 1. Coronary calcium score of 0. This was 0 percentile for age and sex matched control.   2. Normal coronary origin with right dominance.   3. No evidence of CAD.    Echo 11/02/2018:  1. The left ventricle has normal systolic function, with an ejection  fraction of 55-60%. The cavity size was normal. Left ventricular diastolic  Doppler parameters are consistent with impaired relaxation. There is  abnormal septal motion consistent with  conduction system delay.   2. The right ventricle has normal systolic function. The cavity was  normal. There is no increase in right ventricular wall thickness. Right  ventricular systolic pressure is normal with an estimated pressure of 26.2  mmHg.   3. The aortic valve is tricuspid. Aortic valve regurgitation is trivial  by color flow Doppler. No stenosis of the aortic valve.   4. The inferior vena cava was normal in size with <50% respiratory  variability.   5. The aorta is normal in size and structure.    EKG:  EKG is  ordered today.  The ekg ordered today demonstrates sinus rhythm with HR 65, LAFB  Recent Labs: No results found for requested labs within last 365 days.  Recent Lipid Panel No results found for: "CHOL", "TRIG", "HDL", "CHOLHDL", "VLDL", "LDLCALC", "LDLDIRECT"   Risk Assessment/Calculations:                Physical Exam:    VS:  BP 125/80   Pulse 65   Ht 5\' 3"  (1.6 m)    Wt 211 lb (95.7 kg)   SpO2 99%   BMI 37.38 kg/m     Wt Readings from Last 3 Encounters:  01/26/22 211 lb (95.7 kg)  02/21/20 172 lb (78 kg)  02/05/19 192 lb 8 oz (87.3 kg)     GEN:  Well nourished, well developed in no acute distress HEENT: Normal NECK: No JVD; No carotid bruits LYMPHATICS: No lymphadenopathy CARDIAC: RRR, no murmurs, rubs, gallops RESPIRATORY:  Clear to auscultation without rales, wheezing or rhonchi  ABDOMEN: Soft, non-tender, non-distended MUSCULOSKELETAL:  No edema; No deformity  SKIN: Warm and dry NEUROLOGIC:  Alert and oriented x 3 PSYCHIATRIC:  Normal affect   ASSESSMENT:    1. Palpitations   2. Rapid  heart rate   3. Nonspecific abnormal electrocardiogram (ECG) (EKG)   4. Pure hypercholesterolemia    PLAN:    In order of problems listed above:  Palpitations Rapid heart rate Given the infrequency of her episodes, I do  not think we will capture her rapid heart rate on a 14 day monitor. We discussed possible options for monitoring, including apple watch , galaxy watch, and kardia device. I also advised she could call EMS to at least get some run strips if she has another episode. Through shared decision making, I did give her 25 mg lopressor to use PRN if rapid heart rate for longer than 1 hr. We also discussed that her heart rate may drop once she converts.  She shows me labs from May - normal renal function, electrolytes, and TSH. I do not feel strongly about repeating these today. Will monitor.    Abnormal EKG Chest tightness with palpitations Reassuring CTA coronary and echocardiogram in 2021   Hyperlipidemia 08/2021: TC 258 HDL 84 TG 76 LDL 159 Will trial exercise once out of boot Question component of familial hyperlipidemia given family history Repeat lipids in 6 months A1c 5.6%   Follow up in 6 months, sooner if repeat arrhythmia.   Medication Adjustments/Labs and Tests Ordered: Current medicines are reviewed at length with  the patient today.  Concerns regarding medicines are outlined above.  Orders Placed This Encounter  Procedures   EKG 12-Lead   Meds ordered this encounter  Medications   metoprolol tartrate (LOPRESSOR) 25 MG tablet    Sig: Take 1 tablet (25 mg total) by mouth as needed. For palpitations more than 1 hour.    Dispense:  60 tablet    Refill:  2    Patient Instructions  Medication Instructions:  Metoprolol Tartrate 25 mg ( Take 1 Tablet As Needed For Rapid heart Rate More Than 1 Hour). *If you need a refill on your cardiac medications before your next appointment, please call your pharmacy*   Lab Work: No labs If you have labs (blood work) drawn today and your tests are completely normal, you will receive your results only by: [place] (if you have MyChart) OR A paper copy in the mail If you have any lab test that is abnormal or we need to change your treatment, we will call you to review the results.   Testing/Procedures: No Testing   Follow-Up: At [facility], you and your health needs are our priority.  As part of our continuing mission to provide you with exceptional heart care, we have created designated Provider Care Teams.  These Care Teams include your primary Cardiologist (physician) and Advanced Practice Providers (APPs -  Physician Assistants and Nurse Practitioners) who all work together to provide you with the care you need, when you need it.  We recommend signing up for the patient portal called "MyChart".  Sign up information is provided on this After Visit Summary.  MyChart is used to connect with patients for Virtual Visits (Telemedicine).  Patients are able to view lab/test results, encounter notes, upcoming appointments, etc.  Non-urgent messages can be sent to your provider as well.   To learn more about what you can do with MyChart, go to [email or web address].    Your next appointment:   6 month(s)  The format for your next appointment:    In Person  Provider:   Kirk Ruths, MD   Other Instructions Recommend Vagal Maneuvers. Consider [employer] or [employer].   Signed,  Roe Rutherford [place], [place]  01/26/2022 4:29 PM    [facility]

## 2022-01-26 ENCOUNTER — Ambulatory Visit: Payer: BC Managed Care – PPO | Attending: Physician Assistant | Admitting: Physician Assistant

## 2022-01-26 ENCOUNTER — Encounter: Payer: Self-pay | Admitting: Physician Assistant

## 2022-01-26 VITALS — BP 125/80 | HR 65 | Ht 63.0 in | Wt 211.0 lb

## 2022-01-26 DIAGNOSIS — R000 Tachycardia, unspecified: Secondary | ICD-10-CM

## 2022-01-26 DIAGNOSIS — R9431 Abnormal electrocardiogram [ECG] [EKG]: Secondary | ICD-10-CM

## 2022-01-26 DIAGNOSIS — E7800 Pure hypercholesterolemia, unspecified: Secondary | ICD-10-CM

## 2022-01-26 DIAGNOSIS — R002 Palpitations: Secondary | ICD-10-CM

## 2022-01-26 MED ORDER — METOPROLOL TARTRATE 25 MG PO TABS: 25.0000 mg | ORAL_TABLET | ORAL | 2 refills | Status: DC | PRN

## 2022-01-26 NOTE — Patient Instructions (Signed)
Medication Instructions:  Metoprolol Tartrate 25 mg ( Take 1 Tablet As Needed For Rapid heart Rate More Than 1 Hour). *If you need a refill on your cardiac medications before your next appointment, please call your pharmacy*   Lab Work: No labs If you have labs (blood work) drawn today and your tests are completely normal, you will receive your results only by: [place] (if you have MyChart) OR A paper copy in the mail If you have any lab test that is abnormal or we need to change your treatment, we will call you to review the results.   Testing/Procedures: No Testing   Follow-Up: At [facility], you and your health needs are our priority.  As part of our continuing mission to provide you with exceptional heart care, we have created designated Provider Care Teams.  These Care Teams include your primary Cardiologist (physician) and Advanced Practice Providers (APPs -  Physician Assistants and Nurse Practitioners) who all work together to provide you with the care you need, when you need it.  We recommend signing up for the patient portal called "MyChart".  Sign up information is provided on this After Visit Summary.  MyChart is used to connect with patients for Virtual Visits (Telemedicine).  Patients are able to view lab/test results, encounter notes, upcoming appointments, etc.  Non-urgent messages can be sent to your provider as well.   To learn more about what you can do with MyChart, go to [email or web address].    Your next appointment:   6 month(s)  The format for your next appointment:   In Person  Provider:   Kirk Ruths, MD   Other Instructions Recommend Vagal Maneuvers. Consider [employer] or [employer].

## 2022-03-22 ENCOUNTER — Encounter: Payer: Self-pay | Admitting: Cardiology

## 2022-03-22 DIAGNOSIS — I480 Paroxysmal atrial fibrillation: Secondary | ICD-10-CM

## 2022-03-22 MED ORDER — METOPROLOL SUCCINATE ER 25 MG PO TB24: 25.0000 mg | ORAL_TABLET | Freq: Every day | ORAL | 3 refills | Status: DC

## 2022-03-22 MED ORDER — APIXABAN 5 MG PO TABS: 5.0000 mg | ORAL_TABLET | Freq: Two times a day (BID) | ORAL | 6 refills | Status: DC

## 2022-03-22 NOTE — Telephone Encounter (Signed)
ECG shows afib; add toprol 25 mg daily; DC ASA; add apixaban 5 mg BID; schedule APPov; ok to travel if in sinus now  Ann Hatfield

## 2022-03-22 NOTE — Telephone Encounter (Signed)
Spoke with pt, Aware of dr crenshaw's recommendations.  New script sent to the pharmacy  Follow up scheduled  

## 2022-03-24 DIAGNOSIS — I480 Paroxysmal atrial fibrillation: Secondary | ICD-10-CM

## 2022-03-24 MED ORDER — APIXABAN 5 MG PO TABS: 5.0000 mg | ORAL_TABLET | Freq: Two times a day (BID) | ORAL | 6 refills | Status: DC

## 2022-04-05 NOTE — Progress Notes (Unsigned)
Cardiology Clinic Note   Patient Name: Ann Hatfield Date of Encounter: 04/06/2022  Primary Care Provider:  Emelda Fear, DO Primary Cardiologist:  Kirk Ruths, MD  Patient Profile    Ann Hatfield is a 67 y.o. female with a past medical history of palpitations, PAF on anticoagulation who presents to the clinic today for follow-up.  Past Medical History    Past Medical History:  Diagnosis Date   Asthma    Pregnancy induced hypertension    Varicose veins of both lower extremities    Past Surgical History:  Procedure Laterality Date   ABDOMINAL HYSTERECTOMY     BREAST REDUCTION SURGERY     CESAREAN SECTION     ENDOVENOUS ABLATION SAPHENOUS VEIN W/ LASER Left 04/03/2017   endovenous laser ablation left greater saphenous vein and stab phlebectomy 10-20 incisions left leg by Tinnie Gens MD     Allergies  Allergies  Allergen Reactions   Soy Allergy Other (See Comments)    erythema   Penicillins Rash    Syncope,   Was given when was a child, none since   Clindamycin     History of Present Illness    Ann Hatfield has a past medical history of: Palpitations/PAF. Echo 11/02/2018: EF 55 to 60%.  Impaired relaxation.  Abnormal septal motion consistent with conduction system delay.  Trivial AR. Coronary CT 11/23/2018: Calcium score of 0.  Normal coronaries with no evidence of CAD. Hyperlipidemia.  Lipid panel May 2023: LDL 159, HDL 84, TG 76, total 258.   [name] was first evaluated by Dr. Stanford Breed via telemedicine visit on 10/30/2018 for abnormal EKG, chest tightness and palpitations. At that time she reported an isolated episode of heart racing with her Fitbit reporting HR of 140 that lasted almost 24 hours before resolving on its own occurring approximately 1-2 months prior to appointment. She reported associated chest tightness but no syncope. Since that episode she reported occasional chest tightness mostly occurring in early morning hours but not with exertion.  She denied orthopnea, PND or lower extremity edema.  EKG showed possible septal infarct performed at PCP June 2020.Marland Kitchen  Patient has a strong family history of coronary disease with her sister with sudden cardiac death in her 28s. Given no further episodes of palpitations, heart monitor was not ordered. Patient was instructed to report to urgent care for EKG if she had a repeat episode.  Patient was seen in the office on 01/26/2022 by Doreene Adas, PA-C.  She had called into the office 2 days prior to report an episode of tachycardia, shortness of breath, chest pressure and fatigue.  Given infrequency of episodes event monitor was not ordered.  Discussed different options for monitoring including Apple Watch, Galaxy watch, and Kardia device.  She was instructed to call EMS if she has an episode so at the very least she can obtain strips of what her heart rhythm is doing.  She was provided with metoprolol 25 mg to use as needed for rapid heart rate lasting longer than 1 hour.  Her labs from May 2023 showed normal renal function, electrolytes and TSH.  More recently patient submitted EKG strips from [facility] on 03/22/2022 showing atrial fibrillation.  Control advised to begin Toprol 25 mg daily stop aspirin and begin Eliquis 5 mg twice daily.  Today, patient denies further runs of afib. She continues to monitor her rhythm with her Apple Watch. Patient denies dyspnea on exertion. No chest pain, pressure, or tightness. Denies lower  extremity edema, orthopnea, or PND. No palpitations. She reports two episodes of difficulty breathing while laying in bed not improved with sitting up. She describes episodes as feeling as though she could not get enough air into her lungs without chest pain. Sitting up did not improve symptoms. She states she ate a meal of fried foods prior to both episodes. She has a history of GERD for which she takes prn Pepcid. She does not have a history of anxiety but does endorse feeling very  nervous about developing afib secondary to her family history. Discussed cholesterol panel with patient. She wants to work on increasing exercise and improving eating habits now that her foot boot is off (she broke her foot several months ago).    Home Medications    Current Meds  Medication Sig   apixaban (ELIQUIS) 5 MG TABS tablet Take 1 tablet (5 mg total) by mouth 2 (two) times daily.   famotidine-calcium carbonate-magnesium hydroxide (PEPCID COMPLETE) 10-800-165 MG chewable tablet Chew by mouth.   metoprolol succinate (TOPROL XL) 25 MG 24 hr tablet Take 1 tablet (25 mg total) by mouth daily.   polyethylene glycol powder (GLYCOLAX/MIRALAX) powder Take by mouth.   valACYclovir (VALTREX) 1000 MG tablet valacyclovir 1 gram tablet  TK 2 TS PO BID FOR 5 DAYS    Family History    Family History  Problem Relation Age of Onset   Diabetes Mother    Hypertension Mother    Hyperlipidemia Mother    Heart disease Mother    CAD Father    CVA Father    She indicated that her mother is deceased. She indicated that her father is deceased. She indicated that her sister is deceased.   Social History    Social History   Socioeconomic History   Marital status: Married    Spouse name: Not on file   Number of children: 3   Years of education: Not on file   Highest education level: Not on file  Occupational History   Not on file  Tobacco Use   Smoking status: Former   Smokeless tobacco: Never  Substance and Sexual Activity   Alcohol use: Yes    Comment: rarely   Drug use: No   Sexual activity: Not on file  Other Topics Concern   Not on file  Social History Narrative   Not on file   Social Determinants of Health   Financial Resource Strain: Not on file  Food Insecurity: Not on file  Transportation Needs: Not on file  Physical Activity: Not on file  Stress: Not on file  Social Connections: Not on file  Intimate Partner Violence: Not on file     Review of Systems     General:  No chills, fever, night sweats or weight changes.  Cardiovascular:  No chest pain, dyspnea on exertion, edema, orthopnea, palpitations, paroxysmal nocturnal dyspnea. Dermatological: No rash, lesions/masses Respiratory: No cough, dyspnea Urologic: No hematuria, dysuria Abdominal:   No nausea, vomiting, diarrhea, bright red blood per rectum, melena, or hematemesis Neurologic:  No visual changes, weakness, changes in mental status. All other systems reviewed and are otherwise negative except as noted above.  Physical Exam    VS:  BP 130/78 (BP Location: Left Arm, Patient Position: Sitting, Cuff Size: Large)   Pulse 62   Ht 5\' 3"  (1.6 m)   Wt 209 lb (94.8 kg)   BMI 37.02 kg/m  , BMI Body mass index is 37.02 kg/m. GEN:  Well nourished, well developed,  in no acute distress. HEENT: Normal. Neck: Supple, no JVD, carotid bruits, or masses. Cardiac: RRR, no murmurs, rubs, or gallops. No clubbing, cyanosis, edema.  Radials/DP/PT 2+ and equal bilaterally.  Respiratory:  Respirations regular and unlabored, clear to auscultation bilaterally. GI: Soft, nontender, nondistended. MS: No deformity or atrophy. Skin: Warm and dry, no rash. Neuro: Strength and sensation are intact. Psych: Normal affect.  Accessory Clinical Findings    Recent Labs: No results found for requested labs within last 365 days.   Recent Lipid Panel No results found for: "CHOL", "TRIG", "HDL", "CHOLHDL", "VLDL", "LDLCALC", "LDLDIRECT"     ECG personally reviewed by me today: NSR, HR 62  No significant changes from 01/26/2022.   CHA2DS2-VASc Score = 2   This indicates a 2.2% annual risk of stroke. The patient's score is based upon: CHF History: 0 HTN History: 0 Diabetes History: 0 Stroke History: 0 Vascular Disease History: 0 Age Score: 1 Gender Score: 1       Assessment & Plan   PAF/Palpitations. Patient in sinus rhythm today. Patient has several year history of rare episodes of heart racing.   Heart monitor was not ordered secondary to infrequency of episodes.  Patient was able to capture A-fib on Apple Watch.  She was started on Toprol and Eliquis. Patient denies further episodes of afib. She will be provided with patient assistant forms for Eliquis. Discussed at length coumadin vs. Eliquis. She will remain on Eliquis and think about transitioning to coumadin if cost becomes an issue. She is instructed to call office if she has sustained or increased episodes. Continue metoprolol and Eliquis. Cbc, BMP, TSH, Mg.  Hyperlipidemia. LDL May 2023 159. She is hesitant to start on statin and would like to work on diet and exercise. Will repeat lipids 3 months.       Disposition: CBC, BMP, TSH, Mg today. Return in 3 months or sooner as needed.    Justice Britain. Khalidah Herbold, DNP, NP-C     04/06/2022, 4:44 PM [facility] [address] Office [phone] Fax [phone]

## 2022-04-06 ENCOUNTER — Ambulatory Visit: Payer: BC Managed Care – PPO | Attending: Nurse Practitioner | Admitting: Student

## 2022-04-06 ENCOUNTER — Encounter: Payer: Self-pay | Admitting: Nurse Practitioner

## 2022-04-06 VITALS — BP 130/78 | HR 62 | Ht 63.0 in | Wt 209.0 lb

## 2022-04-06 DIAGNOSIS — Z79899 Other long term (current) drug therapy: Secondary | ICD-10-CM | POA: Diagnosis not present

## 2022-04-06 DIAGNOSIS — E785 Hyperlipidemia, unspecified: Secondary | ICD-10-CM

## 2022-04-06 DIAGNOSIS — I480 Paroxysmal atrial fibrillation: Secondary | ICD-10-CM | POA: Diagnosis not present

## 2022-04-06 NOTE — Patient Instructions (Signed)
Medication Instructions:  Your physician recommends that you continue on your current medications as directed. Please refer to the Current Medication list given to you today.   *If you need a refill on your cardiac medications before your next appointment, please call your pharmacy*   Lab Work: Complete lab work today. CMC, BMET, Magnesium, TSH   If you have labs (blood work) drawn today and your tests are completely normal, you will receive your results only by: [place] (if you have MyChart) OR A paper copy in the mail If you have any lab test that is abnormal or we need to change your treatment, we will call you to review the results.   Testing/Procedures: NONE ordered at this time of appointment     Follow-Up: At [facility], you and your health needs are our priority.  As part of our continuing mission to provide you with exceptional heart care, we have created designated Provider Care Teams.  These Care Teams include your primary Cardiologist (physician) and Advanced Practice Providers (APPs -  Physician Assistants and Nurse Practitioners) who all work together to provide you with the care you need, when you need it.  We recommend signing up for the patient portal called "MyChart".  Sign up information is provided on this After Visit Summary.  MyChart is used to connect with patients for Virtual Visits (Telemedicine).  Patients are able to view lab/test results, encounter notes, upcoming appointments, etc.  Non-urgent messages can be sent to your provider as well.   To learn more about what you can do with MyChart, go to [email or web address].    Your next appointment:   3 month(s)  The format for your next appointment:   In Person  Provider:   Kirk Ruths, MD  or APP          Other Instructions   Important Information About Sugar

## 2022-04-07 ENCOUNTER — Telehealth: Payer: Self-pay

## 2022-04-07 LAB — MAGNESIUM: Magnesium: 2.3 mg/dL (ref 1.6–2.3)

## 2022-04-07 LAB — CBC
Hematocrit: 39.2 % (ref 34.0–46.6)
Hemoglobin: 12.9 g/dL (ref 11.1–15.9)
MCH: 30 pg (ref 26.6–33.0)
MCHC: 32.9 g/dL (ref 31.5–35.7)
MCV: 91 fL (ref 79–97)
Platelets: 221 10*3/uL (ref 150–450)
RBC: 4.3 x10E6/uL (ref 3.77–5.28)
RDW: 13.2 % (ref 11.7–15.4)
WBC: 8.4 10*3/uL (ref 3.4–10.8)

## 2022-04-07 LAB — BASIC METABOLIC PANEL
BUN/Creatinine Ratio: 19 (ref 12–28)
BUN: 16 mg/dL (ref 8–27)
CO2: 24 mmol/L (ref 20–29)
Calcium: 9.4 mg/dL (ref 8.7–10.3)
Chloride: 102 mmol/L (ref 96–106)
Creatinine, Ser: 0.83 mg/dL (ref 0.57–1.00)
Glucose: 88 mg/dL (ref 70–99)
Potassium: 4.4 mmol/L (ref 3.5–5.2)
Sodium: 139 mmol/L (ref 134–144)
eGFR: 78 mL/min/{1.73_m2} (ref >59)

## 2022-04-07 LAB — TSH: TSH: 1.9 u[IU]/mL (ref 0.450–4.500)

## 2022-04-07 NOTE — Telephone Encounter (Signed)
Spoke with pt. Pt was notified of lab results.  

## 2022-04-21 DIAGNOSIS — I480 Paroxysmal atrial fibrillation: Secondary | ICD-10-CM

## 2022-04-21 MED ORDER — APIXABAN 5 MG PO TABS: 5.0000 mg | ORAL_TABLET | Freq: Two times a day (BID) | ORAL | 0 refills | Status: DC

## 2022-04-21 NOTE — Telephone Encounter (Signed)
Called patient .  She's states she has the pharmacy on the other line  Per patient . Pharmacy is informing her that a 90 day supply will be $300 dollars. Insurance require a 90 day supply only. They are informing the patient she has to meet her deductible before the price will come down.  Patient states she has 30 day free saving card.  RN informed patient will send a 30 day  60 tablet prescription to [facility]  on [address])  to see if the card can be honored .  Patient is aware to call tomorrow if assistance is needed

## 2022-07-04 NOTE — Progress Notes (Signed)
Cardiology Clinic Note   Patient Name: Ann Hatfield Date of Encounter: 07/06/2022  Primary Care Provider:  Lorelei Pont, DO Primary Cardiologist:  Olga Millers, MD  Patient Profile    Ann Hatfield 67 year old female presents the clinic today for follow-up evaluation of her palpitations and paroxysmal atrial fibrillation.  Past Medical History    Past Medical History:  Diagnosis Date   Asthma    Pregnancy induced hypertension    Varicose veins of both lower extremities    Past Surgical History:  Procedure Laterality Date   ABDOMINAL HYSTERECTOMY     BREAST REDUCTION SURGERY     CESAREAN SECTION     ENDOVENOUS ABLATION SAPHENOUS VEIN W/ LASER Left 04/03/2017   endovenous laser ablation left greater saphenous vein and stab phlebectomy 10-20 incisions left leg by Josephina Gip MD     Allergies  Allergies  Allergen Reactions   Soy Allergy Other (See Comments)    erythema   Penicillins Rash    Syncope,   Was given when was a child, none since   Clindamycin     History of Present Illness    Ann Hatfield has a PMH of paroxysmal atrial fibrillation, palpitations, hyperlipidemia, and chronic anticoagulation.  Echocardiogram 7/20 showed an EF of 55-60%, impaired relaxation, and trivial AR.  She underwent coronary CT 8/20 which showed a coronary calcium score of 0.  She was seen in follow-up via telemedicine visit 7/20 by Dr. Jens Som.  She was noted to have abnormal EKG chest tightness and palpitations.  She reported isolated episodes of heart racing.  She was monitoring her heart rate using her Fitbit which showed heart rates in the 140s that would last for almost 24 hours.  She reported associated chest tightness but denied syncope.  Her EKG 6/20 showed possible septal infarct at her PCP.  She had not had any further episodes of heart palpitations.  She followed up with Angie Duke PA-C 10/23.  She had contacted the office 2 days prior and noted episodes of tachycardia  shortness of breath and chest pressure with fatigue.  Due to infrequency of episodes cardiac event monitor was not ordered.  Options for monitoring her heart rate were discussed.  She was also instructed to contact EMS to obtain EKG rhythm strips if necessary.  She was prescribed metoprolol 25 mg as needed for rapid heart rate lasting longer than 1 hour.  Her lab work 5/23 showed normal renal function electrolytes and TSH.  She submitted EKG strips from her Apple Watch 12/23 which showed atrial fibrillation.  She was instructed to start metoprolol 25 mg daily stop aspirin and begin apixaban 5 mg twice daily.  She was seen in follow-up by Carlos Levering NP-C NP-C on 04/06/2022.  During that time she denied further episodes of palpitations.  She continued to monitor heart rate with her Apple Watch.  She denied dyspnea on exertion.  She denied chest pain.  She reported 2 episodes of difficulty with her breathing while laying in her bed.  Her symptoms improved with sitting up.  She indicated that she had not eaten fried food prior to both episodes of difficulty with her breathing.  Symptoms were felt to be related to acid reflux.  She plans to increase her physical activity.  She presents to the clinic today for follow-up evaluation and states she had an episode of elevated heart rate which she feels like was atrial fibrillation around March 7.  The episode lasted for around 3  to 4 hours and her heart rate returned to normal.  We reviewed viewed paroxysmal atrial fibrillation and she expressed understanding.  I reviewed triggers for atrial fibrillation.  I instructed her that she may take an extra half dose of metoprolol for extended periods of atrial fibrillation/elevated heart rate greater than 115.  We reviewed her recent lab work.  I will plan follow-up in 9 to 12 months.  Today she denies chest pain, shortness of breath, lower extremity edema, fatigue,  melena, hematuria, hemoptysis, diaphoresis,  weakness, presyncope, syncope, orthopnea, and PND.    Home Medications    Prior to Admission medications   Medication Sig Start Date End Date Taking? Authorizing Provider  apixaban (ELIQUIS) 5 MG TABS tablet Take 1 tablet (5 mg total) by mouth 2 (two) times daily. 04/21/22   Carlos Levering, NP  famotidine-calcium carbonate-magnesium hydroxide (PEPCID COMPLETE) 10-800-165 MG chewable tablet Chew by mouth.    [provider]  metoprolol succinate (TOPROL XL) 25 MG 24 hr tablet Take 1 tablet (25 mg total) by mouth daily. 03/22/22   Lewayne Bunting, MD  polyethylene glycol powder (GLYCOLAX/MIRALAX) powder Take by mouth.    [provider]  valACYclovir (VALTREX) 1000 MG tablet valacyclovir 1 gram tablet  TK 2 TS PO BID FOR 5 DAYS    [provider]    Family History    Family History  Problem Relation Age of Onset   Diabetes Mother    Hypertension Mother    Hyperlipidemia Mother    Heart disease Mother    CAD Father    CVA Father    She indicated that her mother is deceased. She indicated that her father is deceased. She indicated that her sister is deceased.  Social History    Social History   Socioeconomic History   Marital status: Married    Spouse name: Not on file   Number of children: 3   Years of education: Not on file   Highest education level: Not on file  Occupational History   Not on file  Tobacco Use   Smoking status: Former   Smokeless tobacco: Never  Substance and Sexual Activity   Alcohol use: Yes    Comment: rarely   Drug use: No   Sexual activity: Not on file  Other Topics Concern   Not on file  Social History Narrative   Not on file   Social Determinants of Health   Financial Resource Strain: Not on file  Food Insecurity: Not on file  Transportation Needs: Not on file  Physical Activity: Not on file  Stress: Not on file  Social Connections: Not on file  Intimate Partner Violence: Not on file     Review of  Systems    General:  No chills, fever, night sweats or weight changes.  Cardiovascular:  No chest pain, dyspnea on exertion, edema, orthopnea, palpitations, paroxysmal nocturnal dyspnea. Dermatological: No rash, lesions/masses Respiratory: No cough, dyspnea Urologic: No hematuria, dysuria Abdominal:   No nausea, vomiting, diarrhea, bright red blood per rectum, melena, or hematemesis Neurologic:  No visual changes, wkns, changes in mental status. All other systems reviewed and are otherwise negative except as noted above.  Physical Exam    VS:  BP 130/74 (BP Location: Left Arm, Patient Position: Sitting, Cuff Size: Large)   Pulse 72   Ht 5\' 3"  (1.6 m)   Wt 215 lb 9.6 oz (97.8 kg)   SpO2 95%   BMI 38.19 kg/m  , BMI Body mass  index is 38.19 kg/m. GEN: Well nourished, well developed, in no acute distress. HEENT: normal. Neck: Supple, no JVD, carotid bruits, or masses. Cardiac: RRR, no murmurs, rubs, or gallops. No clubbing, cyanosis, edema.  Radials/DP/PT 2+ and equal bilaterally.  Respiratory:  Respirations regular and unlabored, clear to auscultation bilaterally. GI: Soft, nontender, nondistended, BS + x 4. MS: no deformity or atrophy. Skin: warm and dry, no rash. Neuro:  Strength and sensation are intact. Psych: Normal affect.  Accessory Clinical Findings    Recent Labs: 04/06/2022: BUN 16; Creatinine, Ser 0.83; Hemoglobin 12.9; Magnesium 2.3; Platelets 221; Potassium 4.4; Sodium 139; TSH 1.900   Recent Lipid Panel No results found for: "CHOL", "TRIG", "HDL", "CHOLHDL", "VLDL", "LDLCALC", "LDLDIRECT"       ECG personally reviewed by me today-none today.  Echocardiogram 11/02/2018  IMPRESSIONS     1. The left ventricle has normal systolic function, with an ejection  fraction of 55-60%. The cavity size was normal. Left ventricular diastolic  Doppler parameters are consistent with impaired relaxation. There is  abnormal septal motion consistent with  conduction system  delay.   2. The right ventricle has normal systolic function. The cavity was  normal. There is no increase in right ventricular wall thickness. Right  ventricular systolic pressure is normal with an estimated pressure of 26.2  mmHg.   3. The aortic valve is tricuspid. Aortic valve regurgitation is trivial  by color flow Doppler. No stenosis of the aortic valve.   4. The inferior vena cava was normal in size with <50% respiratory  variability.   5. The aorta is normal in size and structure.   FINDINGS   Left Ventricle: The left ventricle has normal systolic function, with an  ejection fraction of 55-60%. The cavity size was normal. There is no  increase in left ventricular wall thickness. Left ventricular diastolic  Doppler parameters are consistent with  impaired relaxation. There is abnormal (paradoxical) septal motion,  consistent with left bundle branch block. Definity contrast agent was  given IV to delineate the left ventricular endocardial borders.   Right Ventricle: The right ventricle has normal systolic function. The  cavity was normal. There is no increase in right ventricular wall  thickness. Right ventricular systolic pressure is normal with an estimated  pressure of 26.2 mmHg.   Left Atrium: Left atrial size was normal in size.   Right Atrium: Right atrial size was normal in size. Right atrial pressure  is estimated at 10 mmHg.   Interatrial Septum: No atrial level shunt detected by color flow Doppler.   Pericardium: There is no evidence of pericardial effusion.   Mitral Valve: The mitral valve is normal in structure. Mitral valve  regurgitation is trivial by color flow Doppler.   Tricuspid Valve: The tricuspid valve is normal in structure. Tricuspid  valve regurgitation is trivial by color flow Doppler.   Aortic Valve: The aortic valve is tricuspid Aortic valve regurgitation is  trivial by color flow Doppler. There is No stenosis of the aortic valve.    Pulmonic Valve: The pulmonic valve was grossly normal. Pulmonic valve  regurgitation is trivial by color flow Doppler.   Aorta: The aorta is normal in size and structure.   Venous: The inferior vena cava is normal in size with less than 50%  respiratory variability.    Assessment & Plan   1.  Paroxysmal atrial fibrillation, palpitations-heart rate today 72 bpm.  CHA2DS2-VASc score 2 (age, female).  Reports compliance with apixaban.  Denies bleeding issues.  Continue metoprolol, apixaban.  BMP, CBC, TSH 04/06/2022 unremarkable. Avoid triggers caffeine, chocolate, EtOH, dehydration etc. May take an extra half dose of metoprolol for sustained atrial fibrillation with heart rates greater than 115.  Hyperlipidemia-LDL 159 on 5/23.  Previously reluctant to start statin therapy.  Working on diet and exercise. Repeat fasting lipids  Disposition: Follow-up with Dr. Jens Som in 9 months.   Thomasene Ripple. Polette Nofsinger NP-C     07/06/2022, 3:44 PM [facility] [address] Office [phone] Fax [phone]    I spent 14 minutes examining this patient, reviewing medications, and using patient centered shared decision making involving her cardiac care.  Prior to her visit I spent greater than 20 minutes reviewing her past medical history,  medications, and prior cardiac tests.

## 2022-07-06 ENCOUNTER — Encounter: Payer: Self-pay | Admitting: General Practice

## 2022-07-06 ENCOUNTER — Ambulatory Visit: Payer: BC Managed Care – PPO | Attending: General Practice | Admitting: General Practice

## 2022-07-06 VITALS — BP 130/74 | HR 72 | Ht 63.0 in | Wt 215.6 lb

## 2022-07-06 DIAGNOSIS — I480 Paroxysmal atrial fibrillation: Secondary | ICD-10-CM

## 2022-07-06 DIAGNOSIS — E785 Hyperlipidemia, unspecified: Secondary | ICD-10-CM

## 2022-07-06 NOTE — Patient Instructions (Signed)
Medication Instructions:  FUR SUSTAINED AFIB WITH HEART RATE GREATER 115 FOR GREATER THAN 1 HOUR MAY TAKE EXTRA METOPROLOL 12.5 MG (1/2 TAB) *If you need a refill on your cardiac medications before your next appointment, please call your pharmacy*  Lab Work: NONE If you have labs (blood work) drawn today and your tests are completely normal, you will receive your   results only by:  [place] (if you have MyChart) OR  A paper copy in the mail If you have any lab test that is abnormal or we need to change your treatment, we will call you to review the results.  Other Instructions Please try to avoid these triggers: Do not use any products that have nicotine or tobacco in them. These include cigarettes, e-cigarettes, and chewing tobacco. If you need help quitting, ask your doctor. Eat heart-healthy foods. Talk with your doctor about the right eating plan for you. Exercise regularly as told by your doctor. Stay hydrated Do not drink alcohol, Caffeine or chocolate. Lose weight if you are overweight. Do not use drugs, including cannabis  Follow-Up: At [facility], you and your health needs are our priority.  As part of our continuing mission to provide you with exceptional heart care, we have created designated Provider Care Teams.  These Care Teams include your primary Cardiologist (physician) and Advanced Practice Providers (APPs -  Physician Assistants and Nurse Practitioners) who all work together to provide you with the care you need, when you need it.  Your next appointment:   9 month(s)  Provider:   Kirk Ruths, MD

## 2022-10-24 ENCOUNTER — Other Ambulatory Visit: Payer: Self-pay | Admitting: Cardiology

## 2022-10-24 DIAGNOSIS — I480 Paroxysmal atrial fibrillation: Secondary | ICD-10-CM

## 2022-10-24 NOTE — Telephone Encounter (Signed)
Eliquis 5mg  refill request received. Patient is 67 years old, weight-97.8kg, Crea-0.83 on 04/06/22, Diagnosis-Afib, and last seen by Edd Fabian on 07/06/22. Dose is appropriate based on dosing criteria. Will send in refill to requested pharmacy.

## 2023-03-04 ENCOUNTER — Other Ambulatory Visit: Payer: Self-pay | Admitting: Cardiology

## 2023-03-04 DIAGNOSIS — I480 Paroxysmal atrial fibrillation: Secondary | ICD-10-CM

## 2023-03-08 ENCOUNTER — Encounter: Payer: Self-pay | Admitting: Cardiology

## 2023-05-03 ENCOUNTER — Encounter: Payer: Self-pay | Admitting: Cardiology

## 2023-05-09 NOTE — Progress Notes (Signed)
HPI: Follow-up atrial fibrillation. Echocardiogram July 2020 showed normal LV systolic function, grade 1 diastolic dysfunction, trace aortic insufficiency.  Cardiac CTA August 2020 showed calcium score of 0 and no coronary disease. Patient was diagnosed with atrial fibrillation by ECG strips from her Apple Watch December 2023.  Since last seen she has had 2 separate episodes of atrial fibrillation in January.  Each 1 converted spontaneously.  She otherwise has dyspnea with more vigorous activities but not routine activities.  She denies chest pain or syncope.  No bleeding.  Note she had blood work drawn September 2024 with hemoglobin 13.6, creatinine 0.73.  Current Outpatient Medications  Medication Sig Dispense Refill   ELIQUIS 5 MG TABS tablet TAKE 1 TABLET BY MOUTH TWICE A DAY 180 tablet 1   famotidine-calcium carbonate-magnesium hydroxide (PEPCID COMPLETE) 10-800-165 MG chewable tablet Chew by mouth.     metoprolol succinate (TOPROL-XL) 25 MG 24 hr tablet TAKE 1 TABLET (25 MG TOTAL) BY MOUTH DAILY. 90 tablet 3   polyethylene glycol powder (GLYCOLAX/MIRALAX) powder Take by mouth.     valACYclovir (VALTREX) 1000 MG tablet valacyclovir 1 gram tablet  TK 2 TS PO BID FOR 5 DAYS     No current facility-administered medications for this visit.     Past Medical History:  Diagnosis Date   Asthma    Pregnancy induced hypertension    Varicose veins of both lower extremities     Past Surgical History:  Procedure Laterality Date   ABDOMINAL HYSTERECTOMY     BREAST REDUCTION SURGERY     CESAREAN SECTION     ENDOVENOUS ABLATION SAPHENOUS VEIN W/ LASER Left 04/03/2017   endovenous laser ablation left greater saphenous vein and stab phlebectomy 10-20 incisions left leg by Josephina Gip MD     Social History   Socioeconomic History   Marital status: Married    Spouse name: Not on file   Number of children: 3   Years of education: Not on file   Highest education level: Not on file   Occupational History   Not on file  Tobacco Use   Smoking status: Former   Smokeless tobacco: Never  Substance and Sexual Activity   Alcohol use: Yes    Comment: rarely   Drug use: No   Sexual activity: Not on file  Other Topics Concern   Not on file  Social History Narrative   Not on file   Social Drivers of Health   Financial Resource Strain: Not on file  Food Insecurity: Not on file  Transportation Needs: Not on file  Physical Activity: Not on file  Stress: Not on file  Social Connections: Not on file  Intimate Partner Violence: Not on file    Family History  Problem Relation Age of Onset   Diabetes Mother    Hypertension Mother    Hyperlipidemia Mother    Heart disease Mother    CAD Father    CVA Father     ROS: no fevers or chills, productive cough, hemoptysis, dysphasia, odynophagia, melena, hematochezia, dysuria, hematuria, rash, seizure activity, orthopnea, PND, pedal edema, claudication. Remaining systems are negative.  Physical Exam: Well-developed well-nourished in no acute distress.  Skin is warm and dry.  HEENT is normal.  Neck is supple.  Chest is clear to auscultation with normal expansion.  Cardiovascular exam is regular rate and rhythm.  Abdominal exam nontender or distended. No masses palpated. Extremities show no edema. neuro grossly intact  EKG Interpretation Date/Time:  Tuesday May 23 2023 10:44:52 EST Ventricular Rate:  61 PR Interval:  184 QRS Duration:  120 QT Interval:  394 QTC Calculation: 396 R Axis:   -46  Text Interpretation: Normal sinus rhythm Left anterior fascicular block Left ventricular hypertrophy with QRS widening ( R in aVL , Cornell product ) Cannot rule out Septal infarct , age undetermined No previous ECGs available Confirmed by Olga Millers ([id number]) on 05/23/2023 10:51:03 AM    A/P  1 paroxysmal atrial fibrillation-patient is in sinus rhythm today.  I will increase Toprol to 50 mg daily for improved rate  control if atrial fibrillation recurs.  Continue apixaban.  Repeat echocardiogram.  Will refer to electrophysiology for consideration of ablation given recurrence of arrhythmia.  Patient denies snoring.  2 hyperlipidemia-managed by primary care.  Olga Millers, MD

## 2023-05-10 ENCOUNTER — Encounter: Payer: Self-pay | Admitting: Cardiology

## 2023-05-23 ENCOUNTER — Encounter: Payer: Self-pay | Admitting: Cardiology

## 2023-05-23 ENCOUNTER — Ambulatory Visit: Payer: BC Managed Care – PPO | Attending: Cardiology | Admitting: Cardiology

## 2023-05-23 VITALS — BP 138/90 | HR 61 | Ht 63.0 in | Wt 218.6 lb

## 2023-05-23 DIAGNOSIS — I480 Paroxysmal atrial fibrillation: Secondary | ICD-10-CM | POA: Diagnosis not present

## 2023-05-23 DIAGNOSIS — E785 Hyperlipidemia, unspecified: Secondary | ICD-10-CM | POA: Diagnosis not present

## 2023-05-23 MED ORDER — METOPROLOL SUCCINATE ER 50 MG PO TB24: 50.0000 mg | ORAL_TABLET | Freq: Every day | ORAL | 3 refills | Status: DC

## 2023-05-23 NOTE — Patient Instructions (Addendum)
Medication Instructions:   INCREASE METOPROLOL TO 50- MG ONCE DAILY= 2 OF THE 25 MG TABLETS ONCE DAILY  *If you need a refill on your cardiac medications before your next appointment, please call your pharmacy*   Testing/Procedures:  Your physician has requested that you have an echocardiogram. Echocardiography is a painless test that uses sound waves to create images of your heart. It provides your doctor with information about the size and shape of your heart and how well your heart's chambers and valves are working. This procedure takes approximately one hour. There are no restrictions for this procedure. Please do NOT wear cologne, perfume, aftershave, or lotions (deodorant is allowed). Please arrive 15 minutes prior to your appointment time.  Please note: We ask at that you not bring children with you during ultrasound (echo/ vascular) testing. Due to room size and safety concerns, children are not allowed in the ultrasound rooms during exams. Our front office staff cannot provide observation of children in our lobby area while testing is being conducted. An adult accompanying a patient to their appointment will only be allowed in the ultrasound room at the discretion of the ultrasound technician under special circumstances. We apologize for any inconvenience. SCHEDULE IN THE [place] OFFICE   Follow-Up: At [facility], you and your health needs are our priority.  As part of our continuing mission to provide you with exceptional heart care, we have created designated Provider Care Teams.  These Care Teams include your primary Cardiologist (physician) and Advanced Practice Providers (APPs -  Physician Assistants and Nurse Practitioners) who all work together to provide you with the care you need, when you need it.    Your next appointment:   4 month(s)  Provider:   Olga Millers, MD

## 2023-05-26 ENCOUNTER — Other Ambulatory Visit: Payer: Self-pay | Admitting: Cardiology

## 2023-05-26 DIAGNOSIS — I480 Paroxysmal atrial fibrillation: Secondary | ICD-10-CM

## 2023-05-26 MED ORDER — APIXABAN 5 MG PO TABS: 5.0000 mg | ORAL_TABLET | Freq: Two times a day (BID) | ORAL | 1 refills | Status: DC

## 2023-05-26 NOTE — Telephone Encounter (Signed)
Prescription refill request for Eliquis received. Indication: Afib  Last office visit: 05/23/23 ([place])  Scr: 0.83 (04/06/22)  Age: 68 Weight: 99.2kg  Labs overdue. Pt states she recently had labs drawn at her PCP. Called PCP and requested labs to be faxed to our anticoagulation clinic.

## 2023-05-26 NOTE — Telephone Encounter (Signed)
Labs received from PCP. Scr 0.77 on 05/05/23.  Appropriate dose. Refill sent.

## 2023-06-12 ENCOUNTER — Ambulatory Visit: Payer: BC Managed Care – PPO | Attending: Cardiology

## 2023-06-12 ENCOUNTER — Encounter: Payer: Self-pay | Admitting: *Deleted

## 2023-06-12 DIAGNOSIS — I480 Paroxysmal atrial fibrillation: Secondary | ICD-10-CM

## 2023-06-12 LAB — ECHOCARDIOGRAM COMPLETE
AR max vel: 1.95 cm2
AV Area VTI: 1.7 cm2
AV Area mean vel: 1.73 cm2
AV Mean grad: 6 mmHg
AV Peak grad: 11.4 mmHg
Ao pk vel: 1.69 m/s
Area-P 1/2: 2.24 cm2
Calc EF: 61 %
MV VTI: 1.78 cm2
S' Lateral: 2.9 cm
Single Plane A2C EF: 65.1 %
Single Plane A4C EF: 54.2 %

## 2023-06-12 MED ORDER — PERFLUTREN LIPID MICROSPHERE
1.0000 mL | INTRAVENOUS | Status: AC | PRN
  Administered 2023-06-12: 7 mL via INTRAVENOUS

## 2023-06-27 ENCOUNTER — Ambulatory Visit: Payer: BC Managed Care – PPO | Attending: Cardiovascular Disease | Admitting: Cardiovascular Disease

## 2023-06-27 ENCOUNTER — Encounter: Payer: Self-pay | Admitting: Cardiovascular Disease

## 2023-06-27 VITALS — BP 144/80 | HR 65 | Ht 63.0 in | Wt 220.0 lb

## 2023-06-27 DIAGNOSIS — R002 Palpitations: Secondary | ICD-10-CM

## 2023-06-27 DIAGNOSIS — I480 Paroxysmal atrial fibrillation: Secondary | ICD-10-CM | POA: Diagnosis not present

## 2023-06-27 NOTE — Patient Instructions (Signed)
 Medication Instructions:  Your physician recommends that you continue on your current medications as directed. Please refer to the Current Medication list given to you today. *If you need a refill on your cardiac medications before your next appointment, please call your pharmacy*   Lab Work: CBC and BMET - please have pre-procedure labs completed at ANY [facility] near you - no appointment needed and this does not need to be fasting If you have labs (blood work) drawn today and your tests are completely normal, you will receive your results only by: MyChart Message (if you have MyChart) OR A paper copy in the mail If you have any lab test that is abnormal or we need to change your treatment, we will call you to review the results.   Testing/Procedures: Cardiac CT - someone will contact you to set this up Your physician has requested that you have cardiac CT. Cardiac computed tomography (CT) is a painless test that uses an x-ray machine to take clear, detailed pictures of your heart. For further information please visit [email or web address] Please follow instruction sheet as given.  Atrial Fibrillation Ablation - please contact our office back as soon as possible to pick a date (09/07/23 and 09/11/23 are available)  Your physician has recommended that you have an ablation. Catheter ablation is a medical procedure used to treat some cardiac arrhythmias (irregular heartbeats). During catheter ablation, a long, thin, flexible tube is put into a blood vessel in your groin (upper thigh), or neck. This tube is called an ablation catheter. It is then guided to your heart through the blood vessel. Radio frequency waves destroy small areas of heart tissue where abnormal heartbeats may cause an arrhythmia to start. Please see the instruction sheet given to you today.   Follow-Up: At [facility], you and your health needs are our priority.  As part of our continuing mission to provide you with  exceptional heart care, we have created designated Provider Care Teams.  These Care Teams include your primary Cardiologist (physician) and Advanced Practice Providers (APPs -  Physician Assistants and Nurse Practitioners) who all work together to provide you with the care you need, when you need it.  We recommend signing up for the patient portal called "MyChart".  Sign up information is provided on this After Visit Summary.  MyChart is used to connect with patients for Virtual Visits (Telemedicine).  Patients are able to view lab/test results, encounter notes, upcoming appointments, etc.  Non-urgent messages can be sent to your provider as well.   To learn more about what you can do with MyChart, go to [email or web address].    Your next appointment:   We will schedule follow up after your ablation   Provider:   York Pellant, MD

## 2023-06-27 NOTE — Progress Notes (Signed)
 Electrophysiology Office Note:    Date:  06/27/2023   ID:  Ann Hatfield, Ann Hatfield July 03, 1955, MRN [id number]  PCP:  Lorelei Pont, DO   [facility] Providers Cardiologist:  Olga Millers, MD Cardiology APP:  Marcelino Duster, PA     Referring MD: Lewayne Bunting, MD   History of Present Illness:    Ann Hatfield is a 68 y.o. female with a medical history significant for paroxysmal atrial fibrillation, referred for AF management.     His Apple Watch detected atrial fibrillation, initially March 22, 2022.  The strip is available under encounters.  I discussed the use of AI scribe software for clinical note transcription with the patient, who gave verbal consent to proceed.   She was first diagnosed with atrial fibrillation on March 22, 2022, detected by her Apple Watch. She experiences episodes where her heart feels like it's 'coming out of my chest,' with the first episode lasting approximately 12 hours. The frequency of episodes has increased, with two episodes occurring within the past 30 days.  She is currently taking Eliquis and has been prescribed metoprolol. However, an increase in metoprolol to 50 mg caused her to feel unwell, including lightheadedness upon standing, even at a dose of 25 mg.  An echocardiogram was performed, showing normal heart function.  Her family history includes a sister who died at 52, possibly due to a heart condition, and a father who had a heart attack and a stroke.     Today, she reports that she is doing well and is not currently having any palpitations.  EKGs/Labs/Other Studies Reviewed Today:     Echocardiogram:  TTE June 12, 2023 EF 55 to 60%.  No regional wall motion abnormalities.  Grade 1 diastolic dysfunction.    Advanced imaging:  CT coronary August 2020 Coronary calcium score 0     EKG:   EKG Interpretation Date/Time:  Tuesday June 27 2023 15:27:09 EDT Ventricular Rate:  68 PR Interval:  194 QRS  Duration:  122 QT Interval:  380 QTC Calculation: 404 R Axis:   -57  Text Interpretation: Normal sinus rhythm Left anterior fascicular block Minimal voltage criteria for LVH, may be normal variant ( Cornell product ) Septal infarct (cited on or before 23-May-2023) When compared with ECG of 23-May-2023 10:44, No significant change was found Confirmed by York Pellant [phone]) on 06/27/2023 3:33:44 PM     Physical Exam:    VS:  BP (!) 144/80 (BP Location: Left Arm, Patient Position: Sitting, Cuff Size: Large)   Pulse 65   Ht 5\' 3"  (1.6 m)   Wt 220 lb (99.8 kg)   SpO2 97%   BMI 38.97 kg/m     Wt Readings from Last 3 Encounters:  06/27/23 220 lb (99.8 kg)  05/23/23 218 lb 9.6 oz (99.2 kg)  07/06/22 215 lb 9.6 oz (97.8 kg)     GEN: Well nourished, well developed in no acute distress CARDIAC: RRR, no murmurs, rubs, gallops RESPIRATORY:  Normal work of breathing MUSCULOSKELETAL: no edema    ASSESSMENT & PLAN:     Paroxysmal atrial fibrillation Highly symptomatic Rates uncontrolled I recommended rhythm control, and we discussed options.  Using shared decision making approach, we decided to schedule the ablation procedure If she continues to have recurrence prior to ablation, I will plan to start Multaq She does not well tolerate metoprolol, and there is no room to increase this  We discussed the indication, rationale, logistics, anticipated benefits, and potential risks  of the ablation procedure including but not limited to -- bleed at the groin access site, chest pain, damage to nearby organs such as the diaphragm, lungs, or esophagus, need for a drainage tube, or prolonged hospitalization. I explained that the risk for stroke, heart attack, need for open chest surgery, or even death is very low but not zero. she  expressed understanding and wishes to proceed.   Secondary hypercoagulable state Continue Eliquis 5 mg twice daily  Obesity We discussed the risk of obesity  regarding atrial fibrillation, and I counseled her to lose weight to reduce recurrence of atrial fibrillation      Signed, Maurice Small, MD  06/27/2023 3:57 PM    [facility]

## 2023-06-28 ENCOUNTER — Encounter: Payer: Self-pay | Admitting: Cardiovascular Disease

## 2023-08-07 LAB — CBC
Hematocrit: 44.4 % (ref 34.0–46.6)
Hemoglobin: 14.5 g/dL (ref 11.1–15.9)
MCH: 29.8 pg (ref 26.6–33.0)
MCHC: 32.7 g/dL (ref 31.5–35.7)
MCV: 91 fL (ref 79–97)
Platelets: 249 10*3/uL (ref 150–450)
RBC: 4.87 x10E6/uL (ref 3.77–5.28)
RDW: 13.2 % (ref 11.7–15.4)
WBC: 8.1 10*3/uL (ref 3.4–10.8)

## 2023-08-07 LAB — BASIC METABOLIC PANEL WITH GFR
BUN/Creatinine Ratio: 20 (ref 12–28)
BUN: 17 mg/dL (ref 8–27)
CO2: 22 mmol/L (ref 20–29)
Calcium: 10 mg/dL (ref 8.7–10.3)
Chloride: 102 mmol/L (ref 96–106)
Creatinine, Ser: 0.86 mg/dL (ref 0.57–1.00)
Glucose: 89 mg/dL (ref 70–99)
Potassium: 4.9 mmol/L (ref 3.5–5.2)
Sodium: 139 mmol/L (ref 134–144)
eGFR: 74 mL/min/{1.73_m2} (ref >59)

## 2023-08-14 ENCOUNTER — Encounter: Payer: Self-pay | Admitting: Cardiovascular Disease

## 2023-08-17 DIAGNOSIS — I480 Paroxysmal atrial fibrillation: Secondary | ICD-10-CM

## 2023-08-17 DIAGNOSIS — Z01812 Encounter for preprocedural laboratory examination: Secondary | ICD-10-CM

## 2023-08-18 ENCOUNTER — Encounter: Payer: Self-pay | Admitting: Cardiology

## 2023-08-21 ENCOUNTER — Ambulatory Visit (HOSPITAL_COMMUNITY)
Admission: RE | Admit: 2023-08-21 | Discharge: 2023-08-21 | Disposition: A | Discharge status: Home / Self Care | Source: Ambulatory Visit | Attending: Cardiovascular Disease | Admitting: Cardiovascular Disease

## 2023-08-21 DIAGNOSIS — R002 Palpitations: Secondary | ICD-10-CM | POA: Diagnosis present

## 2023-08-21 DIAGNOSIS — I480 Paroxysmal atrial fibrillation: Secondary | ICD-10-CM | POA: Diagnosis present

## 2023-08-21 MED ORDER — IOHEXOL 350 MG/ML SOLN
100.0000 mL | Freq: Once | INTRAVENOUS | Status: AC | PRN
Start: 2023-08-21 — End: 2023-08-21
  Administered 2023-08-21: 100 mL via INTRAVENOUS

## 2023-08-22 LAB — BASIC METABOLIC PANEL WITH GFR
BUN/Creatinine Ratio: 21 (ref 12–28)
BUN: 17 mg/dL (ref 8–27)
CO2: 21 mmol/L (ref 20–29)
Calcium: 9.7 mg/dL (ref 8.7–10.3)
Chloride: 100 mmol/L (ref 96–106)
Creatinine, Ser: 0.81 mg/dL (ref 0.57–1.00)
Glucose: 94 mg/dL (ref 70–99)
Potassium: 4.8 mmol/L (ref 3.5–5.2)
Sodium: 138 mmol/L (ref 134–144)
eGFR: 80 mL/min/{1.73_m2} (ref >59)

## 2023-08-22 LAB — CBC
Hematocrit: 42.9 % (ref 34.0–46.6)
Hemoglobin: 14 g/dL (ref 11.1–15.9)
MCH: 29.9 pg (ref 26.6–33.0)
MCHC: 32.6 g/dL (ref 31.5–35.7)
MCV: 92 fL (ref 79–97)
Platelets: 264 10*3/uL (ref 150–450)
RBC: 4.69 x10E6/uL (ref 3.77–5.28)
RDW: 13.1 % (ref 11.7–15.4)
WBC: 7.9 10*3/uL (ref 3.4–10.8)

## 2023-08-26 ENCOUNTER — Ambulatory Visit: Payer: Self-pay | Admitting: Cardiovascular Disease

## 2023-09-04 ENCOUNTER — Telehealth (HOSPITAL_COMMUNITY): Payer: Self-pay

## 2023-09-04 NOTE — Telephone Encounter (Signed)
 Attempted to reach patient to discuss upcoming procedure, no answer. Left VM for patient to return call.

## 2023-09-05 NOTE — Telephone Encounter (Signed)
 Patient returned call to discuss upcoming procedure.   CT: completed.  Labs: completed.   Any recent signs of acute illness or been started on antibiotics? No Any new medications started? No Any medications to hold? No Any missed doses of blood thinner? No Advised patient to continue taking ANTICOAGULANT: Eliquis  (Apixaban ) twice daily without missing any doses.  Medication instructions:  On the morning of your procedure DO NOT take any medication., including Eliquis  or the procedure may be rescheduled. Nothing to eat or drink after midnight prior to your procedure.  Confirmed patient is scheduled for Atrial Fibrillation Ablation on Monday, June 9 with Dr. Marlane Silver. Instructed patient to arrive at the Main Entrance A at [facility]: [address] [place], [place] [address] and check in at Admitting at 10:00 AM.  Advised of plan to go home the same day and will only stay overnight if medically necessary. You MUST have a responsible adult to drive you home and MUST be with you the first 24 hours after you arrive home or your procedure could be cancelled.  Patient verbalized understanding to all instructions provided and agreed to proceed with procedure.

## 2023-09-08 ENCOUNTER — Encounter: Payer: Self-pay | Admitting: Emergency Medicine

## 2023-09-08 NOTE — Progress Notes (Deleted)
 HPI: Follow-up atrial fibrillation. Cardiac CTA August 2020 showed calcium score of 0 and no coronary disease. Patient was diagnosed with atrial fibrillation by ECG strips from her Apple Watch December 2023.  Echocardiogram March 2025 showed normal LV function, grade 1 diastolic dysfunction.  CTA May 2025 showed calcium score 0 and no left atrial appendage thrombus.  She is scheduled for atrial fibrillation ablation.  Since last seen   Current Outpatient Medications  Medication Sig Dispense Refill   apixaban  (ELIQUIS ) 5 MG TABS tablet Take 1 tablet (5 mg total) by mouth 2 (two) times daily. 180 tablet 1   famotidine-calcium carbonate-magnesium hydroxide (PEPCID COMPLETE) 10-800-165 MG chewable tablet Chew 1 tablet by mouth 2 (two) times daily as needed (indigestion/heartburn).     metoprolol  succinate (TOPROL -XL) 50 MG 24 hr tablet Take 1 tablet (50 mg total) by mouth daily. (Patient taking differently: Take 25 mg by mouth in the morning.) 90 tablet 3   polyethylene glycol powder (GLYCOLAX/MIRALAX) powder Take 17 g by mouth daily as needed (constipation.).     No current facility-administered medications for this visit.     Past Medical History:  Diagnosis Date   Asthma    Pregnancy induced hypertension    Varicose veins of both lower extremities     Past Surgical History:  Procedure Laterality Date   ABDOMINAL HYSTERECTOMY     BREAST REDUCTION SURGERY     CESAREAN SECTION     ENDOVENOUS ABLATION SAPHENOUS VEIN W/ LASER Left 04/03/2017   endovenous laser ablation left greater saphenous vein and stab phlebectomy 10-20 incisions left leg by Merced Stair MD     Social History   Socioeconomic History   Marital status: Married    Spouse name: Not on file   Number of children: 3   Years of education: Not on file   Highest education level: Not on file  Occupational History   Not on file  Tobacco Use   Smoking status: Former   Smokeless tobacco: Never  Substance and Sexual  Activity   Alcohol use: Yes    Comment: rarely   Drug use: No   Sexual activity: Not on file  Other Topics Concern   Not on file  Social History Narrative   Not on file   Social Drivers of Health   Financial Resource Strain: Not on file  Food Insecurity: Not on file  Transportation Needs: Not on file  Physical Activity: Not on file  Stress: Not on file  Social Connections: Not on file  Intimate Partner Violence: Not on file    Family History  Problem Relation Age of Onset   Diabetes Mother    Hypertension Mother    Hyperlipidemia Mother    Heart disease Mother    CAD Father    CVA Father     ROS: no fevers or chills, productive cough, hemoptysis, dysphasia, odynophagia, melena, hematochezia, dysuria, hematuria, rash, seizure activity, orthopnea, PND, pedal edema, claudication. Remaining systems are negative.  Physical Exam: Well-developed well-nourished in no acute distress.  Skin is warm and dry.  HEENT is normal.  Neck is supple.  Chest is clear to auscultation with normal expansion.  Cardiovascular exam is regular rate and rhythm.  Abdominal exam nontender or distended. No masses palpated. Extremities show no edema. neuro grossly intact  ECG- personally reviewed  A/P  1 paroxysmal atrial fibrillation-patient remains in sinus rhythm.  Continue Toprol  and apixaban .  She is scheduled for atrial fibrillation ablation.  2 hyperlipidemia-Per primary  care.  Note calcium score is 0 and previous CTA showed no coronary disease.  Alexandria Angel, MD

## 2023-09-08 NOTE — Pre-Procedure Instructions (Signed)
 Instructed patient on the following items: Arrival time 1000 Nothing to eat or drink after midnight No meds AM of procedure Responsible person to drive you home and stay with you for 24 hrs  Have you missed any doses of anti-coagulant Eliquis - takes twice a day.  She states she may have missed last nights dose 6/5 PM dose.  Don't take dose morning of procedure.

## 2023-09-11 ENCOUNTER — Ambulatory Visit (HOSPITAL_COMMUNITY)

## 2023-09-11 ENCOUNTER — Encounter (HOSPITAL_COMMUNITY)
Admission: RE | Disposition: A | Discharge status: Home / Self Care | Payer: Self-pay | Source: Home / Self Care | Attending: Cardiovascular Disease

## 2023-09-11 ENCOUNTER — Ambulatory Visit (HOSPITAL_COMMUNITY)
Admission: RE | Admit: 2023-09-11 | Discharge: 2023-09-11 | Disposition: A | Discharge status: Home / Self Care | Attending: Cardiovascular Disease | Admitting: Cardiovascular Disease

## 2023-09-11 ENCOUNTER — Other Ambulatory Visit: Payer: Self-pay

## 2023-09-11 ENCOUNTER — Encounter (HOSPITAL_COMMUNITY): Payer: Self-pay | Admitting: Cardiovascular Disease

## 2023-09-11 DIAGNOSIS — Z6839 Body mass index (BMI) 39.0-39.9, adult: Secondary | ICD-10-CM | POA: Insufficient documentation

## 2023-09-11 DIAGNOSIS — Z8249 Family history of ischemic heart disease and other diseases of the circulatory system: Secondary | ICD-10-CM | POA: Insufficient documentation

## 2023-09-11 DIAGNOSIS — I4891 Unspecified atrial fibrillation: Secondary | ICD-10-CM

## 2023-09-11 DIAGNOSIS — Z7901 Long term (current) use of anticoagulants: Secondary | ICD-10-CM | POA: Diagnosis not present

## 2023-09-11 DIAGNOSIS — I480 Paroxysmal atrial fibrillation: Secondary | ICD-10-CM

## 2023-09-11 DIAGNOSIS — Z87891 Personal history of nicotine dependence: Secondary | ICD-10-CM | POA: Diagnosis not present

## 2023-09-11 DIAGNOSIS — D6869 Other thrombophilia: Secondary | ICD-10-CM | POA: Insufficient documentation

## 2023-09-11 DIAGNOSIS — E66812 Obesity, class 2: Secondary | ICD-10-CM | POA: Insufficient documentation

## 2023-09-11 DIAGNOSIS — I10 Essential (primary) hypertension: Secondary | ICD-10-CM | POA: Insufficient documentation

## 2023-09-11 HISTORY — PX: ATRIAL FIBRILLATION ABLATION: EP1191

## 2023-09-11 LAB — POCT ACTIVATED CLOTTING TIME: Activated Clotting Time: 360 s

## 2023-09-11 SURGERY — ATRIAL FIBRILLATION ABLATION
Anesthesia: General

## 2023-09-11 MED ORDER — HEPARIN (PORCINE) IN NACL 1000-0.9 UT/500ML-% IV SOLN
INTRAVENOUS | Status: DC | PRN
  Administered 2023-09-11 (×3): 500 mL

## 2023-09-11 MED ORDER — MIDAZOLAM HCL 2 MG/2ML IJ SOLN
INTRAMUSCULAR | Status: AC
Start: 2023-09-11 — End: ?
  Filled 2023-09-11: qty 2

## 2023-09-11 MED ORDER — FENTANYL CITRATE (PF) 100 MCG/2ML IJ SOLN
INTRAMUSCULAR | Status: AC
  Filled 2023-09-11: qty 2

## 2023-09-11 MED ORDER — ACETAMINOPHEN 325 MG PO TABS
650.0000 mg | ORAL_TABLET | ORAL | Status: DC | PRN
Start: 2023-09-11 — End: 2023-09-11

## 2023-09-11 MED ORDER — PROTAMINE SULFATE 10 MG/ML IV SOLN
INTRAVENOUS | Status: DC | PRN
  Administered 2023-09-11: 50 mg via INTRAVENOUS

## 2023-09-11 MED ORDER — ONDANSETRON HCL 4 MG/2ML IJ SOLN
INTRAMUSCULAR | Status: DC | PRN
Start: 2023-09-11 — End: 2023-09-11
  Administered 2023-09-11: 4 mg via INTRAVENOUS

## 2023-09-11 MED ORDER — DEXAMETHASONE SODIUM PHOSPHATE 10 MG/ML IJ SOLN
INTRAMUSCULAR | Status: DC | PRN
  Administered 2023-09-11: 10 mg via INTRAVENOUS

## 2023-09-11 MED ORDER — ATROPINE SULFATE 1 MG/ML IV SOLN
INTRAVENOUS | Status: DC | PRN
  Administered 2023-09-11: 1 mg via INTRAVENOUS

## 2023-09-11 MED ORDER — LIDOCAINE 2% (20 MG/ML) 5 ML SYRINGE
INTRAMUSCULAR | Status: DC | PRN
  Administered 2023-09-11: 80 mg via INTRAVENOUS

## 2023-09-11 MED ORDER — PHENYLEPHRINE HCL-NACL 20-0.9 MG/250ML-% IV SOLN
INTRAVENOUS | Status: DC | PRN
  Administered 2023-09-11: 30 ug/min via INTRAVENOUS

## 2023-09-11 MED ORDER — MIDAZOLAM HCL 2 MG/2ML IJ SOLN
INTRAMUSCULAR | Status: DC | PRN
  Administered 2023-09-11: 2 mg via INTRAVENOUS
  Administered 2023-09-11: 1 mg via INTRAVENOUS

## 2023-09-11 MED ORDER — ROCURONIUM BROMIDE 10 MG/ML (PF) SYRINGE
PREFILLED_SYRINGE | INTRAVENOUS | Status: DC | PRN
  Administered 2023-09-11: 70 mg via INTRAVENOUS

## 2023-09-11 MED ORDER — HEPARIN SODIUM (PORCINE) 1000 UNIT/ML IJ SOLN
INTRAMUSCULAR | Status: DC | PRN
  Administered 2023-09-11: 17000 [IU] via INTRAVENOUS

## 2023-09-11 MED ORDER — SODIUM CHLORIDE 0.9 % IV SOLN
INTRAVENOUS | Status: DC
Start: 2023-09-11 — End: 2023-09-11

## 2023-09-11 MED ORDER — PHENYLEPHRINE 80 MCG/ML (10ML) SYRINGE FOR IV PUSH (FOR BLOOD PRESSURE SUPPORT)
PREFILLED_SYRINGE | INTRAVENOUS | Status: DC | PRN
  Administered 2023-09-11: 160 ug via INTRAVENOUS
  Administered 2023-09-11: 120 ug via INTRAVENOUS
  Administered 2023-09-11: 160 ug via INTRAVENOUS

## 2023-09-11 MED ORDER — MIDAZOLAM HCL 2 MG/2ML IJ SOLN
INTRAMUSCULAR | Status: AC
  Filled 2023-09-11: qty 2

## 2023-09-11 MED ORDER — SODIUM CHLORIDE 0.9 % IV SOLN: 250.0000 mL | INTRAVENOUS | Status: DC | PRN

## 2023-09-11 MED ORDER — SUGAMMADEX SODIUM 200 MG/2ML IV SOLN
INTRAVENOUS | Status: DC | PRN
  Administered 2023-09-11: 200 mg via INTRAVENOUS

## 2023-09-11 MED ORDER — PROPOFOL 10 MG/ML IV BOLUS
INTRAVENOUS | Status: DC | PRN
  Administered 2023-09-11: 140 mg via INTRAVENOUS

## 2023-09-11 MED ORDER — ONDANSETRON HCL 4 MG/2ML IJ SOLN: 4.0000 mg | Freq: Four times a day (QID) | INTRAMUSCULAR | Status: DC | PRN

## 2023-09-11 MED ORDER — SODIUM CHLORIDE 0.9% FLUSH: 3.0000 mL | INTRAVENOUS | Status: DC | PRN

## 2023-09-11 MED ORDER — FENTANYL CITRATE (PF) 250 MCG/5ML IJ SOLN
INTRAMUSCULAR | Status: DC | PRN
Start: 2023-09-11 — End: 2023-09-11
  Administered 2023-09-11 (×2): 50 ug via INTRAVENOUS

## 2023-09-11 MED ORDER — PROPOFOL 500 MG/50ML IV EMUL
INTRAVENOUS | Status: DC | PRN
  Administered 2023-09-11: 125 ug/kg/min via INTRAVENOUS

## 2023-09-11 SURGICAL SUPPLY — 20 items
BAG SNAP BAND KOVER 36X36 (MISCELLANEOUS) IMPLANT
CABLE PFA RX CATH CONN (CABLE) IMPLANT
CATH FARAWAVE ABLATION 31 (CATHETERS) IMPLANT
CATH GE 8FR SOUNDSTAR (CATHETERS) IMPLANT
CATH OCTARAY 2.0 F 3-3-3-3-3 (CATHETERS) IMPLANT
CATH WEB BI DIR CSDF CRV REPRO (CATHETERS) IMPLANT
CLOSURE PERCLOSE PROSTYLE (VASCULAR PRODUCTS) IMPLANT
COVER SWIFTLINK CONNECTOR (BAG) ×2 IMPLANT
DEVICE CLOSURE MYNXGRIP 6/7F (Vascular Products) IMPLANT
DILATOR VESSEL 38 20CM 16FR (INTRODUCER) IMPLANT
GUIDEWIRE INQWIRE 1.5J.035X260 (WIRE) IMPLANT
KIT VERSACROSS CNCT FARADRIVE (KITS) IMPLANT
MAT PREVALON FULL STRYKER (MISCELLANEOUS) IMPLANT
PACK EP LF (CUSTOM PROCEDURE TRAY) ×2 IMPLANT
PAD DEFIB RADIO PHYSIO CONN (PAD) ×2 IMPLANT
PATCH CARTO3 (PAD) IMPLANT
SHEATH FARADRIVE STEERABLE (SHEATH) IMPLANT
SHEATH PINNACLE 8F 10CM (SHEATH) IMPLANT
SHEATH PINNACLE 9F 10CM (SHEATH) IMPLANT
SHEATH PROBE COVER 6X72 (BAG) IMPLANT

## 2023-09-11 NOTE — Anesthesia Preprocedure Evaluation (Signed)
 Anesthesia Evaluation  Patient identified by MRN, date of birth, ID band Patient awake    Reviewed: Allergy & Precautions, NPO status , Patient's Chart, lab work & pertinent test results  History of Anesthesia Complications Negative for: history of anesthetic complications  Airway Mallampati: II  TM Distance: >3 FB Neck ROM: Full    Dental  (+) Dental Advisory Given, Teeth Intact   Pulmonary neg shortness of breath, asthma , neg sleep apnea, neg COPD, neg recent URI, former smoker   breath sounds clear to auscultation       Cardiovascular hypertension, Pt. on home beta blockers and Pt. on medications + dysrhythmias Atrial Fibrillation  Rhythm:Regular     Neuro/Psych negative neurological ROS  negative psych ROS   GI/Hepatic negative GI ROS, Neg liver ROS,,,  Endo/Other  negative endocrine ROS    Renal/GU negative Renal ROS     Musculoskeletal negative musculoskeletal ROS (+)    Abdominal   Peds  Hematology  (+) Blood dyscrasia   Anesthesia Other Findings   Reproductive/Obstetrics                              Anesthesia Physical Anesthesia Plan  ASA: 2  Anesthesia Plan: General   Post-op Pain Management: Minimal or no pain anticipated   Induction: Intravenous  PONV Risk Score and Plan: 3 and Propofol infusion and Treatment may vary due to age or medical condition  Airway Management Planned: Oral ETT  Additional Equipment: None  Intra-op Plan:   Post-operative Plan: Extubation in OR  Informed Consent: I have reviewed the patients History and Physical, chart, labs and discussed the procedure including the risks, benefits and alternatives for the proposed anesthesia with the patient or authorized representative who has indicated his/her understanding and acceptance.     Dental advisory given  Plan Discussed with: CRNA  Anesthesia Plan Comments:          Anesthesia  Quick Evaluation

## 2023-09-11 NOTE — Discharge Instructions (Signed)

## 2023-09-11 NOTE — Progress Notes (Signed)
 Patient arrived to cath lab holding s/p ablation. No neuro deficits noted. Bilateral groin sites dry/intact with no bleeding or hematoma. Post activity and precautions explained. Will continue to monitor per order and protocol.Ann Hatfield

## 2023-09-11 NOTE — Progress Notes (Signed)
 Patient walked to the bathroom without difficulties. Bilateral groins level 0, clean, dry, and intact.

## 2023-09-11 NOTE — Anesthesia Procedure Notes (Signed)
 Procedure Name: Intubation Date/Time: 09/11/2023 1:13 PM  Performed by: Viki Graver, CRNAPre-anesthesia Checklist: Patient identified, Emergency Drugs available, Suction available and Patient being monitored Patient Re-evaluated:Patient Re-evaluated prior to induction Oxygen Delivery Method: Circle System Utilized Preoxygenation: Pre-oxygenation with 100% oxygen Induction Type: IV induction Ventilation: Mask ventilation without difficulty Laryngoscope Size: Miller and 2 Grade View: Grade I Tube type: Oral Tube size: 7.0 mm Number of attempts: 1 Airway Equipment and Method: Stylet and Bite block Placement Confirmation: ETT inserted through vocal cords under direct vision, positive ETCO2 and breath sounds checked- equal and bilateral Secured at: 21 cm Tube secured with: Tape Dental Injury: Teeth and Oropharynx as per pre-operative assessment

## 2023-09-11 NOTE — H&P (Signed)
 Electrophysiology Office Note:    Date:  09/11/2023   ID:  Ann Hatfield, DOB Jun 20, 1955, MRN [id number]  PCP:  Dorrine Gaudy, DO   [facility] Providers Cardiologist:  Alexandria Angel, MD Cardiology APP:  Lamond Pilot, [place]  Electrophysiologist:  Efraim Grange, MD     Referring MD: No ref. provider found   History of Present Illness:    Ann Hatfield is a 68 y.o. female with a medical history significant for paroxysmal atrial fibrillation, referred for AF management.     His Apple Watch detected atrial fibrillation, initially March 22, 2022.  The strip is available under encounters.  I discussed the use of AI scribe software for clinical note transcription with the patient, who gave verbal consent to proceed.   She was first diagnosed with atrial fibrillation on March 22, 2022, detected by her Apple Watch. She experiences episodes where her heart feels like it's 'coming out of my chest,' with the first episode lasting approximately 12 hours. The frequency of episodes has increased, with two episodes occurring within the past 30 days.  She is currently taking Eliquis  and has been prescribed metoprolol . However, an increase in metoprolol  to 50 mg caused her to feel unwell, including lightheadedness upon standing, even at a dose of 25 mg.  An echocardiogram was performed, showing normal heart function.  Her family history includes a sister who died at 39, possibly due to a heart condition, and a father who had a heart attack and a stroke.     Today, she reports that she is doing well and is not currently having any palpitations.  I reviewed the patient's CT and labs. There was no LAA thrombus. she  has not missed any doses of anticoagulation, and she took her dose last night. There have been no changes in the patient's diagnoses, medications, or condition since our recent clinic visit.   EKGs/Labs/Other Studies Reviewed Today:     Echocardiogram:  TTE  June 12, 2023 EF 55 to 60%.  No regional wall motion abnormalities.  Grade 1 diastolic dysfunction.    Advanced imaging:  CT coronary August 2020 Coronary calcium score 0     EKG:         Physical Exam:    VS:  BP (!) 148/72   Pulse 70   Temp 98.4 F (36.9 C) (Oral)   Resp 20   Ht 5\' 3"  (1.6 m)   Wt 100.7 kg   SpO2 98%   BMI 39.33 kg/m     Wt Readings from Last 3 Encounters:  09/11/23 100.7 kg  06/27/23 99.8 kg  05/23/23 99.2 kg     GEN: Well nourished, well developed in no acute distress CARDIAC: RRR, no murmurs, rubs, gallops RESPIRATORY:  Normal work of breathing MUSCULOSKELETAL: no edema    ASSESSMENT & PLAN:     Paroxysmal atrial fibrillation Highly symptomatic Rates uncontrolled I recommended rhythm control, and we discussed options.  Using shared decision making approach, we decided to schedule the ablation procedure If she continues to have recurrence prior to ablation, I will plan to start Multaq She does not well tolerate metoprolol , and there is no room to increase this  We discussed the indication, rationale, logistics, anticipated benefits, and potential risks of the ablation procedure including but not limited to -- bleed at the groin access site, chest pain, damage to nearby organs such as the diaphragm, lungs, or esophagus, need for a drainage tube, or prolonged hospitalization. I explained  that the risk for stroke, heart attack, need for open chest surgery, or even death is very low but not zero. she  expressed understanding and wishes to proceed.   Secondary hypercoagulable state Continue Eliquis  5 mg twice daily  Obesity We discussed the risk of obesity regarding atrial fibrillation, and I counseled her to lose weight to reduce recurrence of atrial fibrillation      Signed, Efraim Grange, MD  09/11/2023 12:33 PM    [facility]

## 2023-09-11 NOTE — Transfer of Care (Signed)
 Immediate Anesthesia Transfer of Care Note  Patient: Ann Hatfield  Procedure(s) Performed: ATRIAL FIBRILLATION ABLATION  Patient Location: Cath Lab  Anesthesia Type:General  Level of Consciousness: awake, alert , and oriented  Airway & Oxygen Therapy: Patient Spontanous Breathing and Patient connected to face mask oxygen  Post-op Assessment: Report given to RN and Post -op Vital signs reviewed and stable  Post vital signs: Reviewed and stable  Last Vitals:  Vitals Value Taken Time  BP    Temp    Pulse    Resp    SpO2      Last Pain:  Vitals:   09/11/23 1008  TempSrc:   PainSc: 0-No pain         Complications: No notable events documented.

## 2023-09-12 ENCOUNTER — Telehealth (HOSPITAL_COMMUNITY): Payer: Self-pay

## 2023-09-12 NOTE — Anesthesia Postprocedure Evaluation (Signed)
 Anesthesia Post Note  Patient: Ann Hatfield  Procedure(s) Performed: ATRIAL FIBRILLATION ABLATION     Patient location during evaluation: Cath Lab Anesthesia Type: General Level of consciousness: awake and alert Pain management: pain level controlled Vital Signs Assessment: post-procedure vital signs reviewed and stable Respiratory status: spontaneous breathing, nonlabored ventilation and respiratory function stable Cardiovascular status: stable Postop Assessment: no apparent nausea or vomiting Anesthetic complications: no   No notable events documented.               Desmen Schoffstall

## 2023-09-12 NOTE — Telephone Encounter (Signed)
 Spoke with patient to complete post procedure follow up call.  Patient reports no complications with groin sites.   Instructions reviewed with patient:  Remove large bandage at puncture site after 24 hours. It is normal to have bruising, tenderness, mild swelling, and a pea or marble sized lump/knot at the groin site which can take up to three months to resolve.  Get help right away if you notice sudden swelling at the puncture site.  Check your puncture site every day for signs of infection: fever, redness, swelling, pus drainage, warmth, foul odor or excessive pain. If this occurs, please call the office at [phone], to speak with the nurse. Get help right away if your puncture site is bleeding and the bleeding does not stop after applying firm pressure to the area.  You may continue to have skipped beats/ atrial fibrillation during the first several months after your procedure.  It is very important not to miss any doses of your blood thinner Eliquis . Patient restarted taking this medication on 09/11/23.   You will follow up with the Afib clinic on 10/09/23 and follow up with Dr.Augustus Mealor on 12/22/23 months after your procedure.   Patient verbalized understanding to all instructions provided.

## 2023-09-13 MED FILL — Fentanyl Citrate Preservative Free (PF) Inj 100 MCG/2ML: INTRAMUSCULAR | Qty: 2 | Status: AC

## 2023-09-19 ENCOUNTER — Ambulatory Visit: Payer: BC Managed Care – PPO | Admitting: Cardiology

## 2023-10-09 ENCOUNTER — Ambulatory Visit (HOSPITAL_COMMUNITY)
Admission: RE | Admit: 2023-10-09 | Discharge: 2023-10-09 | Disposition: A | Discharge status: Home / Self Care | Source: Ambulatory Visit | Attending: Internal Medicine | Admitting: Internal Medicine

## 2023-10-09 ENCOUNTER — Ambulatory Visit (HOSPITAL_COMMUNITY): Admitting: Internal Medicine

## 2023-10-09 VITALS — BP 114/80 | HR 64 | Ht 63.0 in | Wt 223.6 lb

## 2023-10-09 DIAGNOSIS — I480 Paroxysmal atrial fibrillation: Secondary | ICD-10-CM

## 2023-10-09 DIAGNOSIS — I4891 Unspecified atrial fibrillation: Secondary | ICD-10-CM

## 2023-10-09 NOTE — Addendum Note (Signed)
 Encounter addended by: Franchot Glade RAMAN, RN on: 10/09/2023 3:09 PM  Actions taken: Order list changed, Diagnosis association updated

## 2023-10-09 NOTE — Progress Notes (Signed)
 Primary Care Physician: Ann Anes, DO Primary Cardiologist: Ann Shallow, MD Electrophysiologist: Ann FORBES Furbish, MD     Referring Physician: Dr. Furbish Ann Hatfield is a 68 y.o. female with a history of atrial fibrillation who presents for consultation in the [facility]. Patient is on Eliquis  5 mg BID for a CHADS2VASC score of 2.  On evaluation today, patient is currently in NSR. S/p Afib ablation on 09/11/23 by Dr. Furbish. No episodes of Afib since ablation confirmed with watch. She does note to me that watch has picked up on possible apneic episodes at night. She has only started wearing her watch at night since the ablation and has not done so previously. No chest pain or SOB. Leg sites healed without issue. No missed doses of anticoagulant.  Today, she denies symptoms of orthopnea, PND, lower extremity edema, dizziness, presyncope, syncope, snoring, daytime somnolence, bleeding, or neurologic sequela. The patient is tolerating medications without difficulties and is otherwise without complaint today.    she has a BMI of Body mass index is 39.61 kg/m.Ann Filed Weights   10/09/23 1410  Weight: 101.4 kg    Current Outpatient Medications  Medication Sig Dispense Refill   acetaminophen  (TYLENOL ) 325 MG tablet Take 650 mg by mouth as needed.     apixaban  (ELIQUIS ) 5 MG TABS tablet Take 1 tablet (5 mg total) by mouth 2 (two) times daily. 180 tablet 1   famotidine-calcium carbonate-magnesium hydroxide (PEPCID COMPLETE) 10-800-165 MG chewable tablet Chew 1 tablet by mouth 2 (two) times daily as needed (indigestion/heartburn). (Patient taking differently: Chew 1 tablet by mouth as needed (indigestion/heartburn).)     metoprolol  succinate (TOPROL -XL) 50 MG 24 hr tablet Take 1 tablet (50 mg total) by mouth daily. (Patient taking differently: Take 25 mg by mouth daily.) 90 tablet 3   polyethylene glycol powder (GLYCOLAX/MIRALAX) powder Take 17 g by  mouth daily as needed (constipation.). (Patient taking differently: Take 17 g by mouth as needed (constipation.).)     valACYclovir (VALTREX) 1000 MG tablet Take 1,000 mg by mouth as needed.     No current facility-administered medications for this encounter.    Atrial Fibrillation Management history:  Previous antiarrhythmic drugs: none Previous cardioversions: none Previous ablations: 09/11/23 Anticoagulation history: Eliquis    ROS- All systems are reviewed and negative except as per the HPI above.  Physical Exam: BP 114/80   Pulse 64   Ht 5' 3 (1.6 m)   Wt 101.4 kg   BMI 39.61 kg/m   GEN: Well nourished, well developed in no acute distress NECK: No JVD; No carotid bruits CARDIAC: Regular rate and rhythm, no murmurs, rubs, gallops RESPIRATORY:  Clear to auscultation without rales, wheezing or rhonchi  ABDOMEN: Soft, non-tender, non-distended EXTREMITIES:  No edema; No deformity   EKG today demonstrates  Vent. rate 64 BPM PR interval 186 ms QRS duration 118 ms QT/QTcB 368/379 ms P-R-T axes -3 -40 -9 Normal sinus rhythm Left axis deviation Minimal voltage criteria for LVH, may be normal variant ( Cornell product ) Anterolateral infarct , age undetermined Abnormal ECG When compared with ECG of 11-Sep-2023 15:04, No significant change was found  Echo 06/12/23 demonstrated  1. No evidence of LV thrombus by Definity . Left ventricular ejection  fraction, by estimation, is 55 to 60%. The left ventricle has normal  function. The left ventricle has no regional wall motion abnormalities.  Left ventricular diastolic parameters are  consistent with Grade I diastolic dysfunction (impaired  relaxation).   2. Right ventricular systolic function is normal. The right ventricular  size is normal.   3. The mitral valve is normal in structure. Trivial mitral valve  regurgitation. No evidence of mitral stenosis.   4. The aortic valve is tricuspid. Aortic valve regurgitation is not   visualized. No aortic stenosis is present.    ASSESSMENT & PLAN CHA2DS2-VASc Score = 2  The patient's score is based upon: CHF History: 0 HTN History: 0 Diabetes History: 0 Stroke History: 0 Vascular Disease History: 0 Age Score: 1 Gender Score: 1       ASSESSMENT AND PLAN: Paroxysmal Atrial Fibrillation (ICD10:  I48.0) The patient's CHA2DS2-VASc score is 2, indicating a 2.2% annual risk of stroke.   S/p Afib ablation on 09/11/23 by Dr. Nancey.   She is currently in NSR. Continue Toprol  25 mg daily. Continue Eliquis  5 mg BID without interruption.  Possible sleep apnea Apneic episodes suggested by watch. Will refer for sleep study.    Follow up with EP as scheduled.   Ann Pac, PA-C  Afib Clinic [facility] [address] [place], [place] [address] [phone]

## 2023-11-09 NOTE — Progress Notes (Signed)
 HPI: Follow-up atrial fibrillation. Cardiac CTA August 2020 showed calcium score of 0 and no coronary disease. Echocardiogram March 2025 showed normal LV function, grade 1 diastolic dysfunction.  CTA May 2025 showed calcium score 0 and no left atrial appendage thrombus.  Had atrial fibrillation ablation June 2025.  Since last seen patient denies dyspnea, chest pain, palpitations or syncope.  Current Outpatient Medications  Medication Sig Dispense Refill   acetaminophen  (TYLENOL ) 325 MG tablet Take 650 mg by mouth as needed.     ELIQUIS  5 MG TABS tablet TAKE 1 TABLET BY MOUTH TWICE A DAY 180 tablet 1   famotidine-calcium carbonate-magnesium hydroxide (PEPCID COMPLETE) 10-800-165 MG chewable tablet Chew 1 tablet by mouth 2 (two) times daily as needed (indigestion/heartburn). (Patient taking differently: Chew 1 tablet by mouth as needed (indigestion/heartburn).)     metoprolol  succinate (TOPROL -XL) 50 MG 24 hr tablet Take 1 tablet (50 mg total) by mouth daily. (Patient taking differently: Take 25 mg by mouth daily.) 90 tablet 3   polyethylene glycol powder (GLYCOLAX/MIRALAX) powder Take 17 g by mouth daily as needed (constipation.). (Patient taking differently: Take 17 g by mouth as needed (constipation.).)     valACYclovir (VALTREX) 1000 MG tablet Take 1,000 mg by mouth as needed.     No current facility-administered medications for this visit.     Past Medical History:  Diagnosis Date   Asthma    Pregnancy induced hypertension    Varicose veins of both lower extremities     Past Surgical History:  Procedure Laterality Date   ABDOMINAL HYSTERECTOMY     ATRIAL FIBRILLATION ABLATION N/A 09/11/2023   Procedure: ATRIAL FIBRILLATION ABLATION;  Surgeon: Nancey Eulas BRAVO, MD;  Location: [facility];  Service: Cardiovascular;  Laterality: N/A;   BREAST REDUCTION SURGERY     CESAREAN SECTION     ENDOVENOUS ABLATION SAPHENOUS VEIN W/ LASER Left 04/03/2017   endovenous laser ablation  left greater saphenous vein and stab phlebectomy 10-20 incisions left leg by Lynwood Collum MD     Social History   Socioeconomic History   Marital status: Married    Spouse name: Not on file   Number of children: 3   Years of education: Not on file   Highest education level: Not on file  Occupational History   Not on file  Tobacco Use   Smoking status: Former   Smokeless tobacco: Never  Substance and Sexual Activity   Alcohol use: Yes    Comment: rarely   Drug use: No   Sexual activity: Not on file  Other Topics Concern   Not on file  Social History Narrative   Not on file   Social Drivers of Health   Financial Resource Strain: Not on file  Food Insecurity: Not on file  Transportation Needs: Not on file  Physical Activity: Not on file  Stress: Not on file  Social Connections: Not on file  Intimate Partner Violence: Not on file    Family History  Problem Relation Age of Onset   Diabetes Mother    Hypertension Mother    Hyperlipidemia Mother    Heart disease Mother    CAD Father    CVA Father     ROS: no fevers or chills, productive cough, hemoptysis, dysphasia, odynophagia, melena, hematochezia, dysuria, hematuria, rash, seizure activity, orthopnea, PND, pedal edema, claudication. Remaining systems are negative.  Physical Exam: Well-developed well-nourished in no acute distress.  Skin is warm and dry.  HEENT is normal.  Neck is supple.  Chest is clear to auscultation with normal expansion.  Cardiovascular exam is regular rate and rhythm.  Abdominal exam nontender or distended. No masses palpated. Extremities show no edema. neuro grossly intact   A/P  1 paroxysmal atrial fibrillation-patient remains in sinus rhythm.  She is status post ablation June 2025.  Continue Toprol  and apixaban .  2 hyperlipidemia-Per primary care.  Redell Shallow, MD

## 2023-11-15 ENCOUNTER — Other Ambulatory Visit: Payer: Self-pay | Admitting: Cardiology

## 2023-11-15 DIAGNOSIS — I480 Paroxysmal atrial fibrillation: Secondary | ICD-10-CM

## 2023-11-15 NOTE — Telephone Encounter (Signed)
 Prescription refill request for Eliquis  received. Indication: PAF Last office visit: 10/09/23  JINNY Heinrich PA-C Scr: 0.81 on 08/21/23  Epic Age: 68 Weight: 101.4kg  Based on above findings Eliquis  5mg  twice daily is the appropriate dose.  Refill approved.

## 2023-11-17 ENCOUNTER — Encounter: Payer: Self-pay | Admitting: Cardiology

## 2023-11-17 ENCOUNTER — Ambulatory Visit: Attending: Cardiology | Admitting: Cardiology

## 2023-11-17 VITALS — BP 145/77 | HR 66 | Ht 63.0 in | Wt 225.0 lb

## 2023-11-17 DIAGNOSIS — E785 Hyperlipidemia, unspecified: Secondary | ICD-10-CM

## 2023-11-17 DIAGNOSIS — I480 Paroxysmal atrial fibrillation: Secondary | ICD-10-CM

## 2023-11-17 NOTE — Patient Instructions (Signed)

## 2023-12-08 ENCOUNTER — Ambulatory Visit: Attending: Cardiovascular Disease | Admitting: Cardiovascular Disease

## 2023-12-08 ENCOUNTER — Encounter: Payer: Self-pay | Admitting: Cardiovascular Disease

## 2023-12-08 VITALS — BP 140/80 | HR 68 | Ht 63.0 in | Wt 220.0 lb

## 2023-12-08 DIAGNOSIS — I480 Paroxysmal atrial fibrillation: Secondary | ICD-10-CM | POA: Diagnosis not present

## 2023-12-08 DIAGNOSIS — D6869 Other thrombophilia: Secondary | ICD-10-CM | POA: Diagnosis not present

## 2023-12-08 NOTE — Patient Instructions (Addendum)
 Medication Instructions:   Stop Eliquis  (Apixaban ) Stop Metoprolol  Suc (Toprol  XL) Continue all other medications.     Labwork:  none  Testing/Procedures:  none  Follow-Up:  8 months   Any Other Special Instructions Will Be Listed Below (If Applicable).   If you need a refill on your cardiac medications before your next appointment, please call your pharmacy.

## 2023-12-08 NOTE — Progress Notes (Signed)
 Electrophysiology Office Note:    Date:  12/08/2023   ID:  Ann Hatfield, DOB 02-27-1956, MRN [id number]  PCP:  Henriette Anes, DO   [facility] Providers Cardiologist:  Redell Shallow, MD Cardiology APP:  Madie Jon Garre, [place]  Electrophysiologist:  Eulas FORBES Furbish, MD     Referring MD: Henriette Anes, DO   History of Present Illness:    Ann Hatfield is a 68 y.o. female with a medical history significant for paroxysmal atrial fibrillation, referred for AF management.      She was first diagnosed with atrial fibrillation on March 22, 2022, detected by her Apple Watch.  History of can be found under the encounters tab.  She experiences episodes where her heart feels like it's 'coming out of my chest,' with the first episode lasting approximately 12 hours. The frequency of episodes has increased.  She is currently taking Eliquis  and has been prescribed metoprolol .  She does not tolerate metoprolol  well, with some lightheadedness, particularly at the higher 50 mg dose.  Her family history includes a sister who died at 63, possibly due to a heart condition, and a father who had a heart attack and a stroke.  She underwent an uncomplicated ablation for atrial fibrillation on September 11, 2023 with ablation of the pulmonary veins and posterior wall of the left atrium by pulsed field ablation.  She reports that she has not had any symptoms to suggest recurrence of atrial fibrillation since the ablation.  She has been wearing an Apple watch and not received any AF alerts.     Today, she reports that she is doing well and is not currently having any palpitations.  EKGs/Labs/Other Studies Reviewed Today:     Echocardiogram:  TTE June 12, 2023 EF 55 to 60%.  No regional wall motion abnormalities.  Grade 1 diastolic dysfunction.    Advanced imaging:  CT coronary August 2020 Coronary calcium score 0     EKG:   EKG Interpretation Date/Time:  Friday December 08 2023  09:19:29 EDT Ventricular Rate:  68 PR Interval:  184 QRS Duration:  122 QT Interval:  380 QTC Calculation: 404 R Axis:   -49  Text Interpretation: Normal sinus rhythm Left anterior fascicular block Left ventricular hypertrophy with QRS widening ( R in aVL , Cornell product ) Cannot rule out Septal infarct (cited on or before 08-Dec-2023) When compared with ECG of 09-Oct-2023 14:17, No significant change was found Confirmed by Furbish Eulas [phone]) on 12/08/2023 9:58:16 AM     Physical Exam:    VS:  BP (!) 140/80   Pulse 68   Ht 5' 3 (1.6 m)   Wt 220 lb (99.8 kg)   SpO2 96%   BMI 38.97 kg/m     Wt Readings from Last 3 Encounters:  12/08/23 220 lb (99.8 kg)  11/17/23 225 lb (102.1 kg)  10/09/23 223 lb 9.6 oz (101.4 kg)     GEN: Well nourished, well developed in no acute distress CARDIAC: RRR, no murmurs, rubs, gallops RESPIRATORY:  Normal work of breathing MUSCULOSKELETAL: no edema    ASSESSMENT & PLAN:     Paroxysmal atrial fibrillation Highly symptomatic Rates uncontrolled in A-fib She does not well tolerate metoprolol  Status post ablation September 11, 2023 --maintaining sinus rhythm Monitoring with Apple watch Will discontinue metoprolol   Secondary hypercoagulable state CHA2DS2-VASc score is 2 for age 72 and female sex In the absence of A-fib, I think risk of anticoagulation outweighs benefit Will discontinue Eliquis  today Continue  A-fib monitoring with watch  Obesity We discussed the risk of obesity regarding atrial fibrillation, and I counseled her to lose weight to reduce recurrence of atrial fibrillation  Obstructive sleep apnea Scheduled for initial CPAP titration We discussed the importance of using CPAP for maintenance of sinus rhythm     Signed, Eulas FORBES Furbish, MD  12/08/2023 9:58 AM    [facility]

## 2023-12-18 ENCOUNTER — Encounter: Payer: Self-pay | Admitting: Cardiology

## 2023-12-22 ENCOUNTER — Ambulatory Visit: Admitting: Cardiovascular Disease

## 2024-05-24 ENCOUNTER — Ambulatory Visit: Admitting: Cardiology
